# Patient Record
Sex: Male | Born: 1985 | Race: Black or African American | Hispanic: No | Marital: Single | Smoking: Never smoker
Health system: Southern US, Community
[De-identification: ages and names within clinical notes are randomized; demographics above are authoritative.]

## PROBLEM LIST (undated history)

## (undated) DIAGNOSIS — A539 Syphilis, unspecified: Secondary | ICD-10-CM

## (undated) DIAGNOSIS — E669 Obesity, unspecified: Secondary | ICD-10-CM

## (undated) DIAGNOSIS — J45909 Unspecified asthma, uncomplicated: Secondary | ICD-10-CM

## (undated) DIAGNOSIS — K529 Noninfective gastroenteritis and colitis, unspecified: Secondary | ICD-10-CM

## (undated) DIAGNOSIS — N529 Male erectile dysfunction, unspecified: Secondary | ICD-10-CM

## (undated) DIAGNOSIS — B20 Human immunodeficiency virus [HIV] disease: Secondary | ICD-10-CM

## (undated) DIAGNOSIS — Z21 Asymptomatic human immunodeficiency virus [HIV] infection status: Secondary | ICD-10-CM

## (undated) HISTORY — DX: Unspecified asthma, uncomplicated: J45.909

## (undated) HISTORY — DX: Noninfective gastroenteritis and colitis, unspecified: K52.9

## (undated) HISTORY — DX: Syphilis, unspecified: A53.9

## (undated) HISTORY — DX: Obesity, unspecified: E66.9

## (undated) HISTORY — PX: OTHER SURGICAL HISTORY: SHX169

## (undated) HISTORY — DX: Male erectile dysfunction, unspecified: N52.9

---

## 2010-11-13 DIAGNOSIS — A539 Syphilis, unspecified: Secondary | ICD-10-CM

## 2010-11-13 HISTORY — DX: Syphilis, unspecified: A53.9

## 2011-09-21 DIAGNOSIS — K529 Noninfective gastroenteritis and colitis, unspecified: Secondary | ICD-10-CM

## 2011-09-21 HISTORY — DX: Noninfective gastroenteritis and colitis, unspecified: K52.9

## 2014-05-09 ENCOUNTER — Emergency Department (HOSPITAL_COMMUNITY): Payer: Worker's Compensation

## 2014-05-09 ENCOUNTER — Emergency Department (INDEPENDENT_AMBULATORY_CARE_PROVIDER_SITE_OTHER): Payer: Worker's Compensation

## 2014-05-09 ENCOUNTER — Emergency Department (INDEPENDENT_AMBULATORY_CARE_PROVIDER_SITE_OTHER)
Admission: EM | Admit: 2014-05-09 | Discharge: 2014-05-09 | Disposition: A | Discharge status: Home / Self Care | Payer: Worker's Compensation | Source: Home / Self Care | Attending: Emergency Medicine | Admitting: Emergency Medicine

## 2014-05-09 ENCOUNTER — Encounter (HOSPITAL_COMMUNITY): Payer: Self-pay | Admitting: Emergency Medicine

## 2014-05-09 DIAGNOSIS — X500XXA Overexertion from strenuous movement or load, initial encounter: Secondary | ICD-10-CM

## 2014-05-09 DIAGNOSIS — Y9368 Activity, volleyball (beach) (court): Secondary | ICD-10-CM

## 2014-05-09 DIAGNOSIS — S93409A Sprain of unspecified ligament of unspecified ankle, initial encounter: Secondary | ICD-10-CM

## 2014-05-09 HISTORY — DX: Human immunodeficiency virus (HIV) disease: B20

## 2014-05-09 HISTORY — DX: Asymptomatic human immunodeficiency virus (hiv) infection status: Z21

## 2014-05-09 MED ORDER — HYDROCODONE-ACETAMINOPHEN 5-325 MG PO TABS: ORAL_TABLET | ORAL | Status: DC

## 2014-05-09 NOTE — ED Notes (Signed)
Pt c/o right foot inj onset yest pm while at work Reports he was playing volleyball and twisted ankle inward/foot outward Sx include swelling; pain increases w/activity Alert w/no signs of acute distress.

## 2014-05-09 NOTE — Discharge Instructions (Signed)

## 2014-05-09 NOTE — ED Provider Notes (Signed)
Chief Complaint   Chief Complaint  Patient presents with  . 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 He did not hear a pop. There is some swelling. It hurts to ambulate and he has to walk with a limp. It also hurts to move the ankle. There is no numbness or tingling. This happened while he was at work, so this is a workers comp case.  Review of Systems   Other than as noted above, the patient denies any of the following symptoms: Systemic:  No fevers or chills.   Musculoskeletal:  No joint pain or swelling, back pain, or neck pain. Neurological:  No muscular weakness or paresthesias.  PMFSH   Past medical history, family history, social history, meds, and allergies were reviewed.   Physical Examination     Vital signs:  BP 137/72  Pulse 58  Temp(Src) 98.6 F (37 C) (Oral)  Resp 16  SpO2 97% Gen:  Alert and oriented times 3.  In no distress. Musculoskeletal: Exam of the ankle reveals slight swelling and pain to palpation over the lateral malleolus. Anterior drawer sign negative.  Talar tilt negative. Squeeze test negative. Achilles tendon, peroneal tendon, and tibialis posterior were intact. Otherwise, all joints had a full a ROM with no swelling, bruising or deformity.  No edema, pulses full. Extremities were warm and pink.  Capillary refill was brisk.  Skin:  Clear, warm and dry.  No rash. Neuro:  Alert and oriented times 3.  Muscle strength was normal.  Sensation was intact to light touch.   Radiology   Dg Ankle Complete Right  05/09/2014   CLINICAL DATA:  Traumatic injury with pain  EXAM: RIGHT ANKLE - COMPLETE 3+ VIEW  COMPARISON:  None.  FINDINGS: No acute fracture or dislocation is noted. The soft tissue show a rectangular density in the plantar aspect of the foot just  below the anterior aspect of the calcaneus. This may represent a chronic foreign body or artifactual in nature. No other focal abnormality is noted.  IMPRESSION: No acute fracture identified.  Density in the soft tissues in the plantar aspect of foot as described. This may represent a chronic foreign body or artifactual in nature. Foot films may be helpful for further evaluation   Electronically Signed   By: Alcide Clever M.D.   On: 05/09/2014 18:38   I reviewed the images independently and personally and concur with the radiologist's findings.  Course in Urgent Care Center   He was placed in an ASO brace and given crutches.  Assessment   The encounter diagnosis was Ankle sprain.  Since this was a workers comp injury he'll need a followup at occupational health.  Plan     1.  Meds:  The following meds were prescribed:   Discharge Medication List as of 05/09/2014  7:11 PM    START taking these medications   Details  HYDROcodone-acetaminophen (NORCO/VICODIN) 5-325 MG per tablet 1 to 2 tabs every 4 to 6 hours as needed for pain., Print        2.  Patient Education/Counseling:  The patient was given appropriate handouts, self care instructions, including rest and activity, elevation, application of ice and compression, and instructed in symptomatic relief.  Given exercises to start doing in about 3 days.  3.  Follow up:  The patient was told to follow up here if no better in 3 to 4 days, or sooner if becoming worse in any way, and given some red flag symptoms such as increasing pain or neurological symptoms which would prompt immediate return.  Follow up at occupational health in 2 days.     Reuben Likesavid C Mattheu Brodersen, MD 05/09/14 2121

## 2015-08-14 IMAGING — CR DG ANKLE COMPLETE 3+V*R*
3 series · 3 of 3 positions shown · non-contrast
Comparison: None.

CLINICAL DATA: Traumatic injury with pain

EXAM:
RIGHT ANKLE - COMPLETE 3+ VIEW

[view not recorded (1 of 3)]
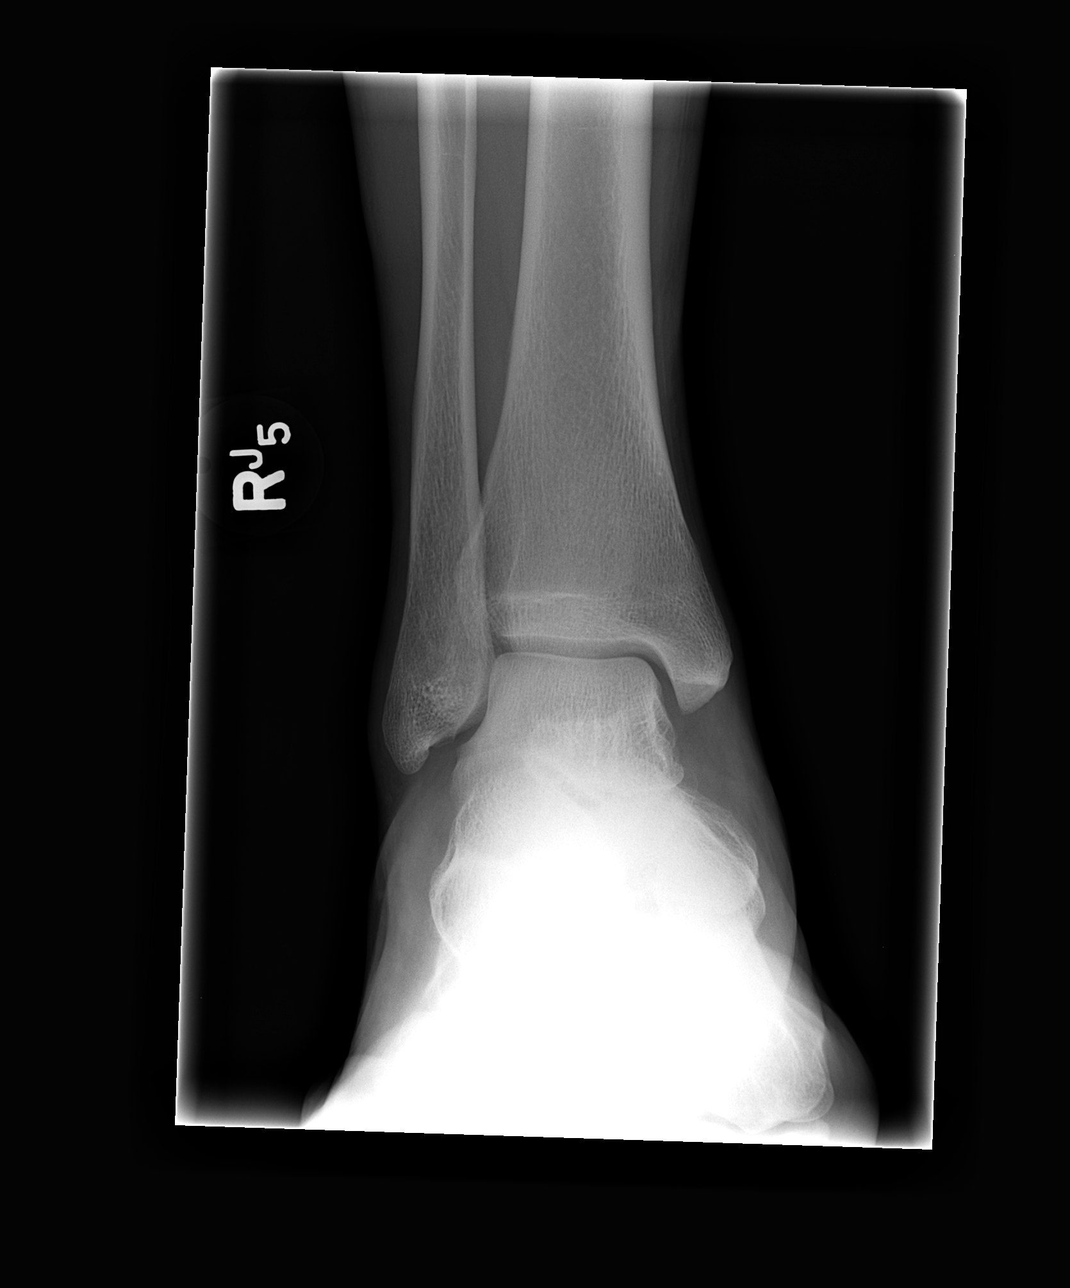

[view not recorded (2 of 3)]
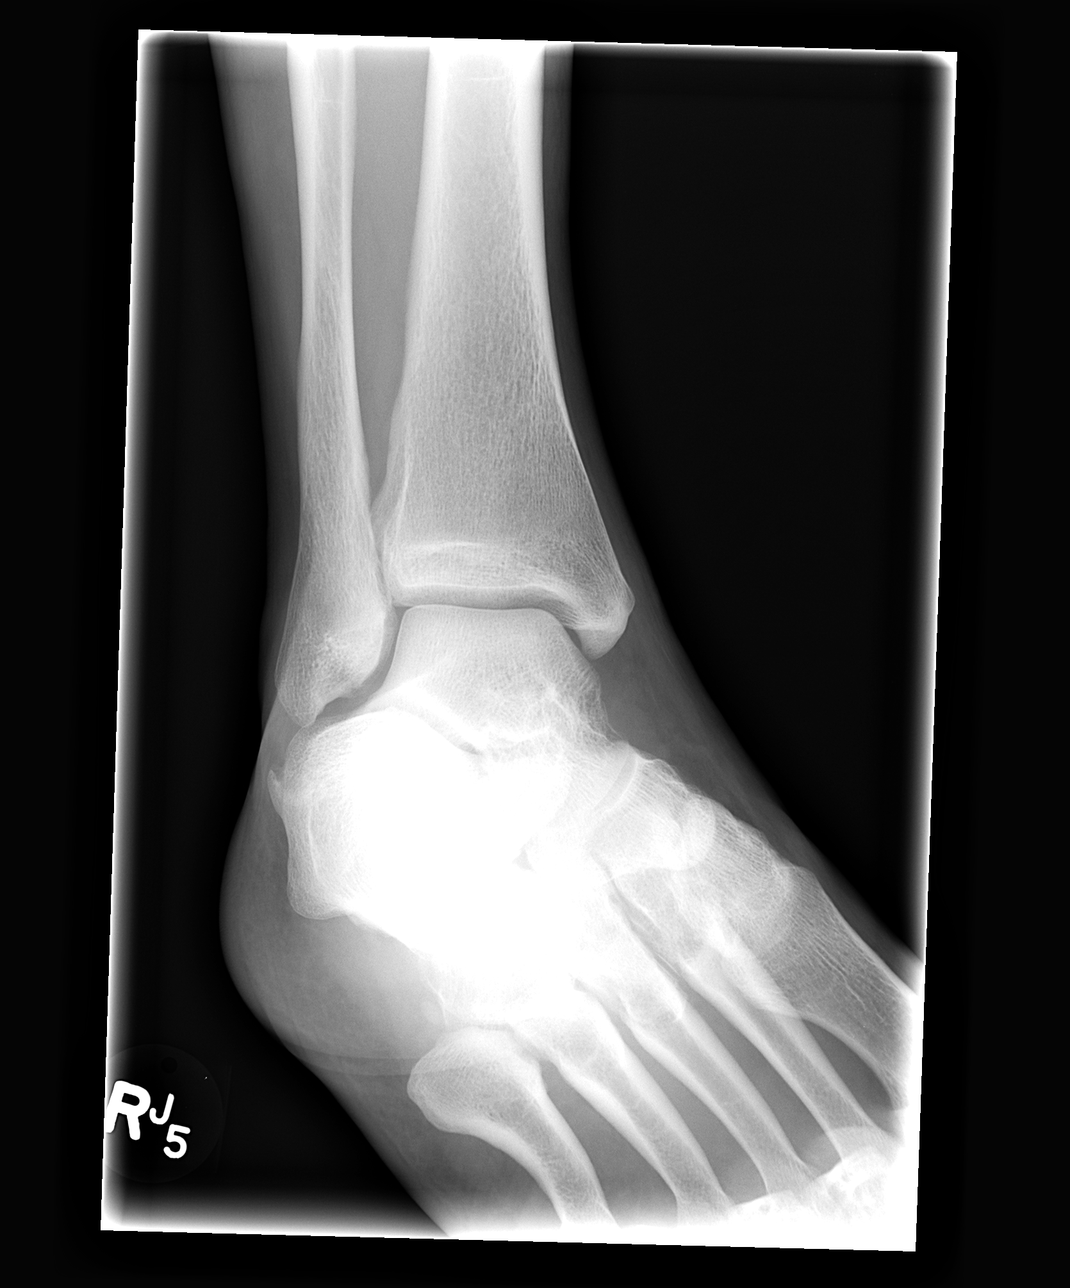

[view not recorded (3 of 3)]
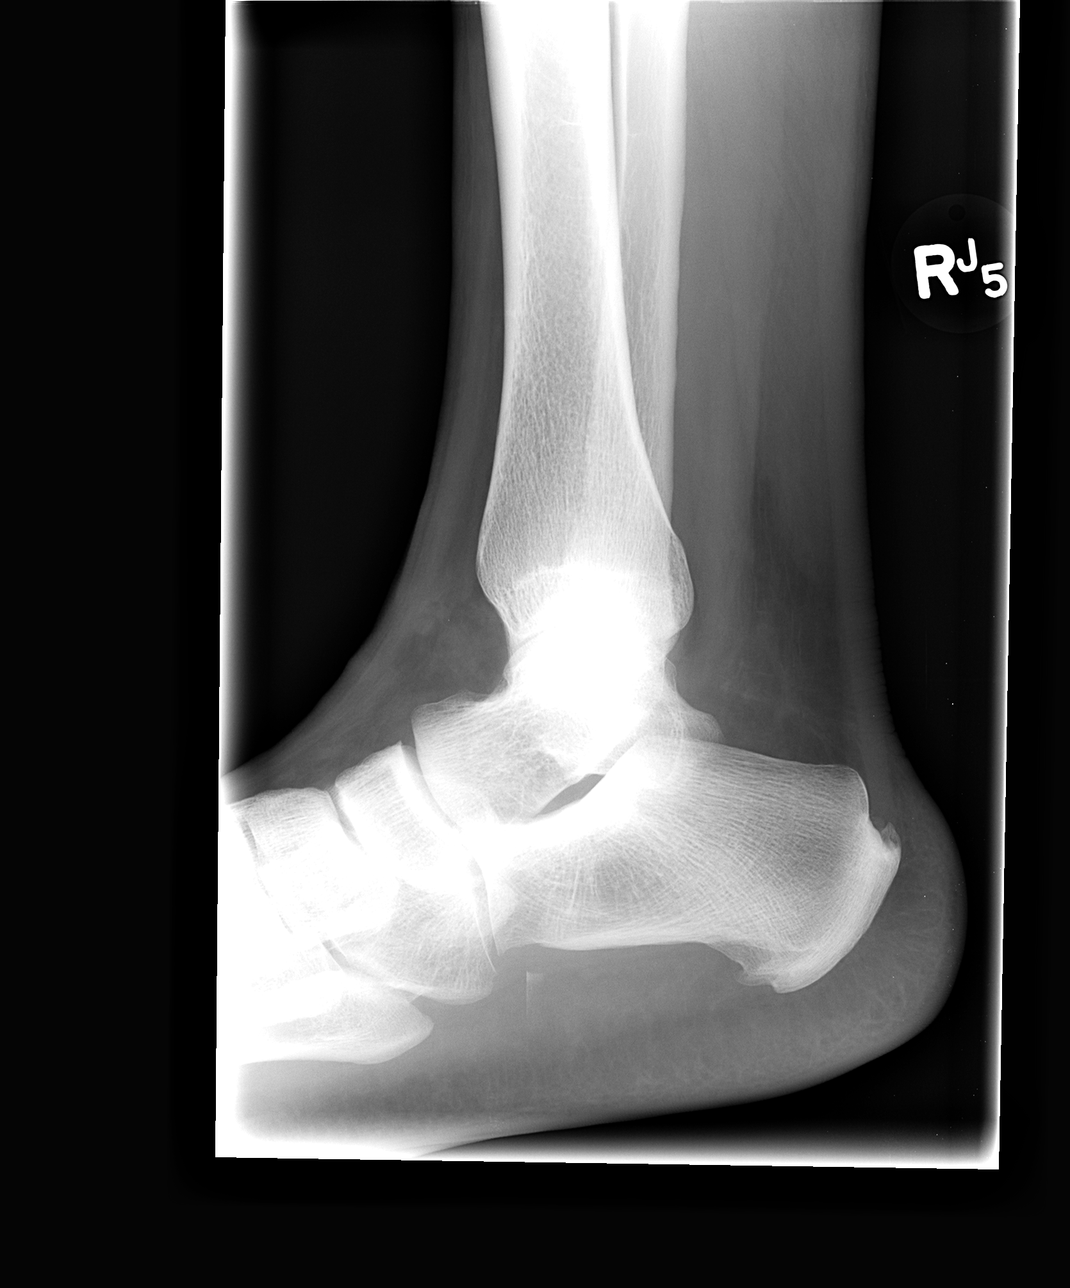

[3 of 3 positions shown; findings below may reference images not displayed]

FINDINGS: No acute fracture or dislocation is noted. The soft tissue show a
rectangular density in the plantar aspect of the foot just below the
anterior aspect of the calcaneus. This may represent a chronic
foreign body or artifactual in nature. No other focal abnormality is
noted.
IMPRESSION: No acute fracture identified.

Density in the soft tissues in the plantar aspect of foot as
described. This may represent a chronic foreign body or artifactual
in nature. Foot films may be helpful for further evaluation

## 2016-09-13 ENCOUNTER — Ambulatory Visit: Payer: Self-pay

## 2016-11-19 DIAGNOSIS — A539 Syphilis, unspecified: Secondary | ICD-10-CM | POA: Insufficient documentation

## 2016-11-19 DIAGNOSIS — B20 Human immunodeficiency virus [HIV] disease: Secondary | ICD-10-CM | POA: Insufficient documentation

## 2016-11-19 DIAGNOSIS — A519 Early syphilis, unspecified: Secondary | ICD-10-CM | POA: Insufficient documentation

## 2016-11-20 ENCOUNTER — Ambulatory Visit (INDEPENDENT_AMBULATORY_CARE_PROVIDER_SITE_OTHER): Payer: BC Managed Care – PPO | Admitting: Internal Medicine

## 2016-11-20 ENCOUNTER — Encounter: Payer: Self-pay | Admitting: Internal Medicine

## 2016-11-20 DIAGNOSIS — Z23 Encounter for immunization: Secondary | ICD-10-CM

## 2016-11-20 DIAGNOSIS — B20 Human immunodeficiency virus [HIV] disease: Secondary | ICD-10-CM | POA: Diagnosis not present

## 2016-11-20 DIAGNOSIS — N529 Male erectile dysfunction, unspecified: Secondary | ICD-10-CM | POA: Insufficient documentation

## 2016-11-20 DIAGNOSIS — N528 Other male erectile dysfunction: Secondary | ICD-10-CM

## 2016-11-20 DIAGNOSIS — Z8619 Personal history of other infectious and parasitic diseases: Secondary | ICD-10-CM | POA: Diagnosis not present

## 2016-11-20 MED ORDER — ELVITEG-COBIC-EMTRICIT-TENOFAF 150-150-200-10 MG PO TABS: 1.0000 | ORAL_TABLET | Freq: Every day | ORAL | 11 refills | Status: DC

## 2016-11-20 NOTE — Assessment & Plan Note (Signed)
His infection is under excellent long-term control. He will continue Genvoya and get lab work today. He will follow-up in 6 months. I had him meet with Ulyses SouthwardMinh Pham, our infectious disease pharmacist today. He was given small pill bottles so that he can keep 1-2 doses of his Genvoya in his car in case he is away from home without his medication. I talked to him about the importance of adherence.

## 2016-11-20 NOTE — Assessment & Plan Note (Signed)
I talked to him about the importance of limiting the number of partners disease has, careful partner selection and consistent condom use. We will monitor his RPR.

## 2016-11-20 NOTE — Assessment & Plan Note (Signed)
He had some problems maintaining erections earlier this year. His grandmother had died and he was feeling somewhat down. Problem seems to have improved since that time.

## 2016-11-20 NOTE — Progress Notes (Signed)
Patient Active Problem List   Diagnosis Date Noted  . HIV disease (HCC) 11/19/2016    Priority: High  . Erectile dysfunction 11/20/2016  . History of syphilis 11/19/2016    Patient's Medications  New Prescriptions   No medications on file  Previous Medications   MULTIPLE VITAMIN (MULTIVITAMIN) TABLET    Take 1 tablet by mouth daily.   SILDENAFIL (VIAGRA) 100 MG TABLET    Take 100 mg by mouth daily as needed for erectile dysfunction.  Modified Medications   Modified Medication Previous Medication   ELVITEGRAVIR-COBICISTAT-EMTRICITABINE-TENOFOVIR (GENVOYA) 150-150-200-10 MG TABS TABLET elvitegravir-cobicistat-emtricitabine-tenofovir (GENVOYA) 150-150-200-10 MG TABS tablet      Take 1 tablet by mouth daily with breakfast.    Take 1 tablet by mouth daily with breakfast.  Discontinued Medications   HYDROCODONE-ACETAMINOPHEN (NORCO/VICODIN) 5-325 MG PER TABLET    1 to 2 tabs every 4 to 6 hours as needed for pain.    Subjective: Aaron Forbes is in for his initial visit to establish ongoing care for his HIV infection. He was diagnosed in 2011 when a male partner tested positive. [facility]. His CD4 count was 468 and his initial viral load was 7500. He started on Atripla. His viral load became undetectable and has been ever since. He transitioned to Stribild and then at the time of his last visit at [facility] this past April [facility]. He takes it each evening with a meal. He recalls missing only once in the past 6 months. This occurred when he went to visit his parents outside [place] and forgot his medication. He is in very good health otherwise. He had asthma as a child but outgrew it. He has mild erectile dysfunction. He is currently not in a relationship and not sexually active. He was treated for syphilis earlier this year. He had one episode of rectal bleeding, hematuria and urinary hesitancy after having anal receptive  intercourse earlier this year. He was evaluated by urology and gastroenterology with no positive findings reported by him. He has had no further episodes. He states that he tries to be consistent with condoms with himself and his partners. He does not use tobacco products and drinks alcohol only socially. He has never used any illicit drugs. He exercises regularly including resistance weightlifting and running. He completed his first 5K run this year.  Since learning of his test results he has shared that information with his brother and with his 32 year old nephew who lives with him. He has not chosen to tell his parents. He graduated from [employer]. 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   Review of Systems: Review of Systems  Constitutional: Negative for chills, diaphoresis, fever, malaise/fatigue and weight loss.  HENT: Negative for sore throat.   Respiratory: Negative for cough, sputum production and shortness of breath.   Cardiovascular: Negative for chest pain.  Gastrointestinal: Negative for abdominal pain, diarrhea, heartburn, nausea and vomiting.  Genitourinary: Negative for dysuria and frequency.  Musculoskeletal: Negative for joint pain and myalgias.  Skin: Negative for rash.  Neurological: Negative for dizziness and headaches.  Psychiatric/Behavioral: Negative for depression and substance abuse. The patient is not nervous/anxious.     Past Medical History:  Diagnosis Date  . Asthma   . Colitis 09/21/2011  . ED (erectile dysfunction)   . HIV (human immunodeficiency virus infection) (HCC)   . Obesity, unspecified obesity  severity, unspecified obesity type   . Syphilis 11/13/2010    Social History  Substance Use Topics  . Smoking status: Never Smoker  . Smokeless tobacco: Never Used  . Alcohol use Yes    Family History  Problem Relation Age of Onset  . Diabetes Mother   . Hypertension Mother   . Diabetes Father   .  Hypertension Father   . Stroke Father     No Known Allergies  Objective:  Vitals:   11/20/16 0854  BP: 131/78  Pulse: (!) 43  Temp: 97.8 F (36.6 C)  TempSrc: Oral  Weight: 241 lb 8 oz (109.5 kg)  Height: 6\' 9"  (2.057 m)   Body mass index is 25.88 kg/m.  Physical Exam  Constitutional: He is oriented to person, place, and time.  He is very pleasant and asked very good questions.  HENT:  Mouth/Throat: No oropharyngeal exudate.  Eyes: Conjunctivae are normal.  Cardiovascular: Normal rate and regular rhythm.   No murmur heard. Pulmonary/Chest: Effort normal and breath sounds normal. He has no wheezes. He has no rales.  Abdominal: Soft. He exhibits no mass. There is no tenderness.  Musculoskeletal: Normal range of motion. He exhibits no edema or tenderness.  Neurological: He is alert and oriented to person, place, and time.  Skin: No rash noted.  Psychiatric: Mood and affect normal.    Lab Results No results found for: WBC, HGB, HCT, MCV, PLT No results found for: CREATININE, BUN, NA, K, CL, CO2 No results found for: ALT, AST, GGT, ALKPHOS, BILITOT  No results found for: CHOL, HDL, LDLCALC, LDLDIRECT, TRIG, CHOLHDL No results found for: [id number], HIV1RNAVL, CD4TABS   Problem List Items Addressed This Visit      High   HIV disease (HCC)    His infection is under excellent long-term control. He will continue Genvoya and get lab work today. He will follow-up in 6 months. I had him meet with Ulyses SouthwardMinh Pham, our infectious disease pharmacist today. He was given small pill bottles so that he can keep 1-2 doses of his Genvoya in his car in case he is away from home without his medication. I talked to him about the importance of adherence.      Relevant Medications   elvitegravir-cobicistat-emtricitabine-tenofovir (GENVOYA) 150-150-200-10 MG TABS tablet   Other Relevant Orders   T-helper cell (CD4)- ([facility] clinic only)   HIV 1 RNA quant-no reflex-bld   T-helper cell (CD4)-  ([facility] clinic only)   HIV 1 RNA quant-no reflex-bld   CBC   Comprehensive metabolic panel   Lipid panel   RPR     Unprioritized   Erectile dysfunction    He had some problems maintaining erections earlier this year. His grandmother had died and he was feeling somewhat down. Problem seems to have improved since that time.      History of syphilis    I talked to him about the importance of limiting the number of partners disease has, careful partner selection and consistent condom use. We will monitor his RPR.       Other Visit Diagnoses    Encounter for immunization       Relevant Orders   Flu Vaccine QUAD 36+ mos IM (Completed)        Cliffton AstersJohn Kyna Blahnik, MD [facility] [phone] pager   [phone] cell 11/20/2016, 12:07 PM

## 2016-11-21 LAB — T-HELPER CELL (CD4) - (RCID CLINIC ONLY)
CD4 T CELL HELPER: 35 % (ref 33–55)
CD4 T Cell Abs: 820 /uL (ref 400–2700)

## 2016-11-25 LAB — HIV-1 RNA QUANT-NO REFLEX-BLD
HIV 1 RNA Quant: <20 copies/mL (ref <20)
HIV-1 RNA Quant, Log: <1.3 Log copies/mL (ref <1.30)

## 2016-12-02 ENCOUNTER — Encounter: Payer: Self-pay | Admitting: *Deleted

## 2017-04-30 ENCOUNTER — Other Ambulatory Visit: Payer: Self-pay

## 2017-05-14 ENCOUNTER — Encounter: Payer: Self-pay | Admitting: Internal Medicine

## 2017-05-14 ENCOUNTER — Ambulatory Visit (INDEPENDENT_AMBULATORY_CARE_PROVIDER_SITE_OTHER): Payer: BC Managed Care – PPO | Admitting: Internal Medicine

## 2017-05-14 VITALS — BP 135/83 | HR 40 | Temp 97.6°F | Ht >= 80 in | Wt 240.0 lb

## 2017-05-14 DIAGNOSIS — B20 Human immunodeficiency virus [HIV] disease: Secondary | ICD-10-CM

## 2017-05-14 DIAGNOSIS — Z23 Encounter for immunization: Secondary | ICD-10-CM

## 2017-05-14 LAB — LIPID PANEL
CHOLESTEROL: 120 mg/dL (ref <200)
HDL: 51 mg/dL (ref >40)
LDL Cholesterol: 60 mg/dL (ref <100)
Total CHOL/HDL Ratio: 2.4 Ratio (ref <5.0)
Triglycerides: 46 mg/dL (ref <150)
VLDL: 9 mg/dL (ref <30)

## 2017-05-14 LAB — CBC
HCT: 42 % (ref 38.5–50.0)
Hemoglobin: 13.9 g/dL (ref 13.2–17.1)
MCH: 30.8 pg (ref 27.0–33.0)
MCHC: 33.1 g/dL (ref 32.0–36.0)
MCV: 92.9 fL (ref 80.0–100.0)
MPV: 9 fL (ref 7.5–12.5)
Platelets: 215 10*3/uL (ref 140–400)
RBC: 4.52 MIL/uL (ref 4.20–5.80)
RDW: 12.5 % (ref 11.0–15.0)
WBC: 4.2 10*3/uL (ref 3.8–10.8)

## 2017-05-14 LAB — COMPREHENSIVE METABOLIC PANEL
ALBUMIN: 4 g/dL (ref 3.6–5.1)
ALT: 24 U/L (ref 9–46)
AST: 31 U/L (ref 10–40)
Alkaline Phosphatase: 34 U/L — ABNORMAL LOW (ref 40–115)
BILIRUBIN TOTAL: 1 mg/dL (ref 0.2–1.2)
BUN: 17 mg/dL (ref 7–25)
CO2: 25 mmol/L (ref 20–31)
CREATININE: 1.01 mg/dL (ref 0.60–1.35)
Calcium: 9.2 mg/dL (ref 8.6–10.3)
Chloride: 103 mmol/L (ref 98–110)
Glucose, Bld: 93 mg/dL (ref 65–99)
Potassium: 4.3 mmol/L (ref 3.5–5.3)
SODIUM: 136 mmol/L (ref 135–146)
Total Protein: 7.3 g/dL (ref 6.1–8.1)

## 2017-05-14 NOTE — Assessment & Plan Note (Signed)
His adherence is very good and his infection was under perfect control at the time of his last blood work in December. He will get repeat blood work today. 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 I've given him to pill canisters to carry on his key chain. He will follow-up in 6 months.

## 2017-05-14 NOTE — Progress Notes (Addendum)
Patient Active Problem List   Diagnosis Date Noted  . HIV disease (HCC) 11/19/2016    Priority: High  . Erectile dysfunction 11/20/2016  . History of syphilis 11/19/2016    Patient's Medications  New Prescriptions   No medications on file  Previous Medications   ELVITEGRAVIR-COBICISTAT-EMTRICITABINE-TENOFOVIR (GENVOYA) 150-150-200-10 MG TABS TABLET    Take 1 tablet by mouth daily with breakfast.   MULTIPLE VITAMIN (MULTIVITAMIN) TABLET    Take 1 tablet by mouth daily.  Modified Medications   No medications on file  Discontinued Medications   SILDENAFIL (VIAGRA) 100 MG TABLET    Take 100 mg by mouth daily as needed for erectile dysfunction.    Subjective: Aaron Forbes is in for his routine HIV follow-up visit. High school year ended yesterday and he is going to be off on vacation for the summer. [place] will be traveling to [place] to spend some time with his sister and [place] His current partner is HIV negative. He is taking Truvada PrEP. They have had unprotected sex. He is feeling well and has no current complaints. He has had no problems obtaining, taking or tolerating Genvoya. He generally takes it each afternoon. He recalls missing only one dose since his last visit.   Review of Systems: Review of Systems  Constitutional: Negative for chills, diaphoresis, fever, malaise/fatigue and weight loss.  HENT: Negative for sore throat.   Respiratory: Negative for cough, sputum production and shortness of breath.   Cardiovascular: Negative for chest pain.  Gastrointestinal: Negative for abdominal pain, diarrhea, heartburn, nausea and vomiting.  Genitourinary: Negative for dysuria and frequency.  Musculoskeletal: Negative for joint pain and myalgias.  Skin: Negative for rash.  Neurological: Negative for dizziness and headaches.  Psychiatric/Behavioral: Negative for depression and substance abuse. The patient is not  nervous/anxious.     Past Medical History:  Diagnosis Date  . Asthma   . Colitis 09/21/2011  . ED (erectile dysfunction)   . HIV (human immunodeficiency virus infection) (HCC)   . Obesity, unspecified obesity severity, unspecified obesity type   . Syphilis 11/13/2010    Social History  Substance Use Topics  . Smoking status: Never Smoker  . Smokeless tobacco: Never Used  . Alcohol use Yes     Comment: socially    Family History  Problem Relation Age of Onset  . Diabetes Mother   . Hypertension Mother   . Diabetes Father   . Hypertension Father   . Stroke Father     No Known Allergies  Objective:  Vitals:   05/14/17 1012  BP: 135/83  Pulse: (!) 40  Temp: 97.6 F (36.4 C)  TempSrc: Oral  Weight: 240 lb (108.9 kg)  Height: 6\' 9"  (2.057 m)   Body mass index is 25.72 kg/m.  Physical Exam  Constitutional: He is oriented to person, place, and time.  He is in good spirits.  HENT:  Mouth/Throat: No oropharyngeal exudate.  Eyes: Conjunctivae are normal.  Cardiovascular: Normal rate and regular rhythm.   No murmur heard. Pulmonary/Chest: Effort normal and breath sounds normal.  Abdominal: Soft. He exhibits no mass. There is no tenderness.  Musculoskeletal: Normal range of motion.  Neurological: He is alert and oriented to person, place, and time.  Skin: No rash noted.  Psychiatric: Mood and affect normal.    Lab Results Lab Results  Component Value Date   WBC 4.2 05/14/2017   HGB 13.9  05/14/2017   HCT 42.0 05/14/2017   MCV 92.9 05/14/2017   PLT 215 05/14/2017    Lab Results  Component Value Date   CREATININE 1.01 05/14/2017   BUN 17 05/14/2017   NA 136 05/14/2017   K 4.3 05/14/2017   CL 103 05/14/2017   CO2 25 05/14/2017    Lab Results  Component Value Date   ALT 24 05/14/2017   AST 31 05/14/2017   ALKPHOS 34 (L) 05/14/2017   BILITOT 1.0 05/14/2017    Lab Results  Component Value Date   CHOL 120 05/14/2017   HDL 51 05/14/2017   LDLCALC  60 05/14/2017   TRIG 46 05/14/2017   CHOLHDL 2.4 05/14/2017   HIV 1 RNA Quant (copies/mL)  Date Value  05/14/2017 <20 NOT DETECTED  11/20/2016 <20   CD4 T Cell Abs (/uL)  Date Value  05/14/2017 1,120  11/20/2016 820     Problem List Items Addressed This Visit      High   HIV disease (HCC) - Primary    His adherence is very good and his infection was under perfect control at the time of his last blood work in December. He will get repeat blood work today. 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 I've given him to pill canisters to carry on his key chain. He will follow-up in 6 months.      Relevant Orders   T-helper cell (CD4)- ([facility] clinic only) (Completed)   HIV 1 RNA quant-no reflex-bld (Completed)   CBC (Completed)   Comprehensive metabolic panel (Completed)   RPR (Completed)   Lipid panel (Completed)   MENINGOCOCCAL MCV4O(MENVEO) (Completed)   Pneumococcal polysaccharide vaccine 23-valent greater than or equal to 2yo subcutaneous/IM (Completed)    Other Visit Diagnoses    Need for meningitis vaccination       Relevant Orders   MENINGOCOCCAL MCV4O(MENVEO) (Completed)   Need for pneumococcal vaccination       Relevant Orders   Pneumococcal polysaccharide vaccine 23-valent greater than or equal to 2yo subcutaneous/IM (Completed)        Cliffton Asters, MD [facility] [phone] pager   [phone] cell 05/22/2017, 2:48 PM

## 2017-05-15 LAB — FLUORESCENT TREPONEMAL AB(FTA)-IGG-BLD: Fluorescent Treponemal ABS: REACTIVE — AB

## 2017-05-15 LAB — T-HELPER CELL (CD4) - (RCID CLINIC ONLY)
CD4 T CELL ABS: 1120 /uL (ref 400–2700)
CD4 T CELL HELPER: 37 % (ref 33–55)

## 2017-05-15 LAB — RPR: RPR Ser Ql: REACTIVE — AB

## 2017-05-15 LAB — RPR TITER

## 2017-05-20 LAB — HIV-1 RNA QUANT-NO REFLEX-BLD
HIV 1 RNA Quant: <20 copies/mL
HIV-1 RNA QUANT, LOG: NOT DETECTED {Log_copies}/mL

## 2017-11-09 ENCOUNTER — Other Ambulatory Visit: Payer: Self-pay | Admitting: Internal Medicine

## 2017-11-10 NOTE — Telephone Encounter (Signed)
Needs office visit.

## 2017-11-17 ENCOUNTER — Ambulatory Visit (INDEPENDENT_AMBULATORY_CARE_PROVIDER_SITE_OTHER): Payer: BC Managed Care – PPO | Admitting: Internal Medicine

## 2017-11-17 ENCOUNTER — Encounter: Payer: Self-pay | Admitting: Internal Medicine

## 2017-11-17 DIAGNOSIS — B20 Human immunodeficiency virus [HIV] disease: Secondary | ICD-10-CM

## 2017-11-17 DIAGNOSIS — Z23 Encounter for immunization: Secondary | ICD-10-CM | POA: Diagnosis not present

## 2017-11-17 DIAGNOSIS — Z8619 Personal history of other infectious and parasitic diseases: Secondary | ICD-10-CM

## 2017-11-17 MED ORDER — ELVITEG-COBIC-EMTRICIT-TENOFAF 150-150-200-10 MG PO TABS: 1.0000 | ORAL_TABLET | Freq: Every day | ORAL | 11 refills | Status: DC
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
## 2017-11-17 NOTE — Assessment & Plan Note (Signed)
His infection has been under excellent, long-term control. He will get lab work today, continue [place] and follow-up in one year. I did talk to him about the importance of having a frank discussion with his partner about the danger of not having a mutually monogamous relationship.

## 2017-11-17 NOTE — Assessment & Plan Note (Signed)
I will get records from his primary care provider about his recent STD testing. He will have an RPR checked here today. The issue of using doxycycline as postexposure prophylaxis for the prevention of syphilis is still an open question. I suggested that he stop doxycycline now. He knows to call me if he develops any lesions.

## 2017-11-18 ENCOUNTER — Telehealth: Payer: Self-pay | Admitting: *Deleted

## 2017-11-18 ENCOUNTER — Encounter: Payer: Self-pay | Admitting: Internal Medicine

## 2017-11-18 ENCOUNTER — Other Ambulatory Visit: Payer: Self-pay | Admitting: Internal Medicine

## 2017-11-18 DIAGNOSIS — A519 Early syphilis, unspecified: Secondary | ICD-10-CM

## 2017-11-18 LAB — RPR: RPR: REACTIVE — AB

## 2017-11-18 LAB — RPR TITER: RPR Titer: 1:64 {titer} — ABNORMAL HIGH

## 2017-11-18 LAB — FLUORESCENT TREPONEMAL AB(FTA)-IGG-BLD: FLUORESCENT TREPONEMAL ABS: REACTIVE — AB

## 2017-11-18 MED ORDER — PENICILLIN G BENZATHINE 1200000 UNIT/2ML IM SUSP: 1.2000 10*6.[IU] | Freq: Once | INTRAMUSCULAR | Status: DC

## 2017-11-18 NOTE — Telephone Encounter (Signed)
Per Dr. 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 Called patient and no answer and his voice mail is full. Will try to call in the AM. Aaron MolaJacqueline Cockerham CMA

## 2017-11-19 LAB — HIV-1 RNA QUANT-NO REFLEX-BLD
HIV 1 RNA QUANT: NOT DETECTED {copies}/mL
HIV-1 RNA Quant, Log: <1.3 Log copies/mL

## 2017-11-19 LAB — T-HELPER CELL (CD4) - (RCID CLINIC ONLY)
CD4 % Helper T Cell: 39 % (ref 33–55)
CD4 T Cell Abs: 1150 /uL (ref 400–2700)

## 2017-11-20 NOTE — Telephone Encounter (Signed)
Per Dr. Guilford Shiampbell, Bobbie at the health department has been notified and she will let Mathis FareAlbert know as well. They had [facility] They do have patient's demographic info. Wendall MolaJacqueline Cockerham CMA

## 2018-07-25 ENCOUNTER — Other Ambulatory Visit: Payer: Self-pay

## 2018-07-25 ENCOUNTER — Ambulatory Visit (HOSPITAL_COMMUNITY)
Admission: EM | Admit: 2018-07-25 | Discharge: 2018-07-25 | Disposition: A | Discharge status: Home / Self Care | Payer: BC Managed Care – PPO | Attending: Internal Medicine | Admitting: Internal Medicine

## 2018-07-25 ENCOUNTER — Encounter (HOSPITAL_COMMUNITY): Payer: Self-pay

## 2018-07-25 DIAGNOSIS — W57XXXA Bitten or stung by nonvenomous insect and other nonvenomous arthropods, initial encounter: Secondary | ICD-10-CM

## 2018-07-25 DIAGNOSIS — S70362A Insect bite (nonvenomous), left thigh, initial encounter: Secondary | ICD-10-CM

## 2018-07-25 MED ORDER — TRIAMCINOLONE ACETONIDE 0.1 % EX CREA: 1.0000 "application " | TOPICAL_CREAM | Freq: Two times a day (BID) | CUTANEOUS | 0 refills | Status: DC

## 2018-07-25 NOTE — Discharge Instructions (Signed)
Continue with benadryl regularly.  Ice application.  Use of kenalog cream for the next few days until symptoms resolve, shouldn't use this long term while taking Genvoya.  I would expect gradual improvement over the next week.  If develop worsening of pain, redness, swelling, drainage or otherwise worsening please return to be seen.

## 2018-07-25 NOTE — ED Triage Notes (Signed)
Pt has a insect bite on the back of his left leg, this happened today.

## 2018-07-25 NOTE — ED Provider Notes (Signed)
[facility]    CSN: [id number] Arrival date & time: 07/25/18  1534     History   Chief Complaint Chief Complaint  Patient presents with  . Insect Bite    HPI Aaron Forbes is a 32 y.o. male.   Aaron Forbes presents with complaints of bug bite to distal posterior thigh which occurred yesterday. States he was jogging and he felt it immediately and was painful. Has since been taking benadryl and applying ice. Mildly tender but has not increased. No increased in diameter but mildly more puffy than had been . No further pain. No itching. No drainage. Unknown what type of bug. 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 Hx of asthma, colitis, syphilis.    ROS per HPI.      Past Medical History:  Diagnosis Date  . Asthma   . Colitis 09/21/2011  . ED (erectile dysfunction)   . HIV (human immunodeficiency virus infection) (HCC)   . Obesity, unspecified obesity severity, unspecified obesity type   . Syphilis 11/13/2010    Patient Active Problem List   Diagnosis Date Noted  . Erectile dysfunction 11/20/2016  . HIV disease (HCC) 11/19/2016  . 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 Dental extractions         Home Medications    Prior to Admission medications   Medication Sig Start Date End Date Taking? Authorizing Provider  elvitegravir-cobicistat-emtricitabine-tenofovir (GENVOYA) 150-150-200-10 MG TABS tablet Take 1 tablet by mouth daily with breakfast. 11/17/17   Cliffton Astersampbell, John, MD  Multiple Vitamin (MULTIVITAMIN) tablet Take 1 tablet by mouth daily.    [provider]  triamcinolone cream (KENALOG) 0.1 % Apply 1 application topically 2 (two) times daily. 07/25/18   Georgetta HaberBurky, Natalie B, NP    Family History Family History  Problem Relation Age of Onset  . Diabetes Mother   . Hypertension Mother   . Diabetes Father   . Hypertension Father    . Stroke Father     Social History Social History   Tobacco Use  . Smoking status: Never Smoker  . Smokeless tobacco: Never Used  Substance Use Topics  . Alcohol use: Yes    Comment: socially  . Drug use: No     Allergies   Patient has no known allergies.   Review of Systems Review of Systems   Physical Exam Triage Vital Signs ED Triage Vitals  Enc Vitals Group     BP 07/25/18 1548 (!) 155/72     Pulse --      Resp 07/25/18 1548 18     Temp 07/25/18 1548 97.8 F (36.6 C)     Temp Source 07/25/18 1548 Oral     SpO2 07/25/18 1548 100 %     Weight 07/25/18 1551 231 lb (104.8 kg)     Height --      Head Circumference --      Peak Flow --      Pain Score 07/25/18 1551 8     Pain Loc --      Pain Edu? --      Excl. in GC? --    No data found.  Updated Vital Signs BP (!) 155/72   Temp 97.8 F (36.6 C) (Oral)   Resp 18   Wt 231 lb (104.8 kg)   SpO2 100%   BMI 24.75 kg/m    Physical  Exam  Constitutional: He is oriented to person, place, and time. He appears well-developed and well-nourished.  Cardiovascular: Normal rate and regular rhythm.  Pulmonary/Chest: Effort normal and breath sounds normal.  Musculoskeletal:       Left upper leg: He exhibits tenderness and swelling. He exhibits no deformity and no laceration.       Legs: Redness and swelling, well defined, soft, no fluctuance or induration; non tender; small puncture at center, no foreign body; no drainage  Neurological: He is alert and oriented to person, place, and time.  Skin: Skin is warm and dry.     UC Treatments / Results  Labs (all labs ordered are listed, but only abnormal results are displayed) Labs Reviewed - No data to display  EKG None  Radiology No results found.  Procedures Procedures (including critical care time)  Medications Ordered in UC Medications - No data to display  Initial Impression / Assessment and Plan / UC Course  I have reviewed the triage vital signs  and the nursing notes.  Pertinent labs & imaging results that were available during my care of the patient were reviewed by me and considered in my medical decision making (see chart for details).     Finding consistent with localized reaction from bug bite. Not worsening. Continue with supportive cares, ice, benadryl, kenalog as needed. Return precautions provided. Patient verbalized understanding and agreeable to plan.    Final Clinical Impressions(s) / UC Diagnoses   Final diagnoses:  Insect bite of left thigh, initial encounter     Discharge Instructions     Continue with benadryl regularly.  Ice application.  Use of kenalog cream for the next few days until symptoms resolve, shouldn't use this long term while taking Genvoya.  I would expect gradual improvement over the next week.  If develop worsening of pain, redness, swelling, drainage or otherwise worsening please return to be seen.    ED Prescriptions    Medication Sig Dispense Auth. Provider   triamcinolone cream (KENALOG) 0.1 % Apply 1 application topically 2 (two) times daily. 30 g Georgetta Haber, NP     Controlled Substance Prescriptions [place] Controlled Substance Registry consulted? Not Applicable   Georgetta Haber, NP 07/25/18 1700

## 2018-11-03 ENCOUNTER — Other Ambulatory Visit: Payer: BC Managed Care – PPO

## 2018-11-03 DIAGNOSIS — B20 Human immunodeficiency virus [HIV] disease: Secondary | ICD-10-CM

## 2018-11-05 LAB — COMPREHENSIVE METABOLIC PANEL
AG Ratio: 1.3 (calc) (ref 1.0–2.5)
ALT: 19 U/L (ref 9–46)
AST: 31 U/L (ref 10–40)
Albumin: 4.3 g/dL (ref 3.6–5.1)
Alkaline phosphatase (APISO): 44 U/L (ref 40–115)
BUN: 15 mg/dL (ref 7–25)
CO2: 29 mmol/L (ref 20–32)
Calcium: 9.6 mg/dL (ref 8.6–10.3)
Chloride: 102 mmol/L (ref 98–110)
Creat: 1.16 mg/dL (ref 0.60–1.35)
Globulin: 3.3 g/dL (calc) (ref 1.9–3.7)
Glucose, Bld: 85 mg/dL (ref 65–99)
Potassium: 4.3 mmol/L (ref 3.5–5.3)
Sodium: 140 mmol/L (ref 135–146)
Total Bilirubin: 0.6 mg/dL (ref 0.2–1.2)
Total Protein: 7.6 g/dL (ref 6.1–8.1)

## 2018-11-05 LAB — CBC
HCT: 42.9 % (ref 38.5–50.0)
Hemoglobin: 14.8 g/dL (ref 13.2–17.1)
MCH: 31.4 pg (ref 27.0–33.0)
MCHC: 34.5 g/dL (ref 32.0–36.0)
MCV: 91.1 fL (ref 80.0–100.0)
MPV: 10.2 fL (ref 7.5–12.5)
PLATELETS: 264 10*3/uL (ref 140–400)
RBC: 4.71 10*6/uL (ref 4.20–5.80)
RDW: 10.9 % — ABNORMAL LOW (ref 11.0–15.0)
WBC: 5.4 10*3/uL (ref 3.8–10.8)

## 2018-11-05 LAB — HIV-1 RNA QUANT-NO REFLEX-BLD
HIV 1 RNA Quant: <20 copies/mL
HIV-1 RNA Quant, Log: <1.3 Log copies/mL

## 2018-11-05 LAB — LIPID PANEL
CHOL/HDL RATIO: 2.6 (calc) (ref <5.0)
Cholesterol: 144 mg/dL (ref <200)
HDL: 55 mg/dL (ref >40)
LDL Cholesterol (Calc): 72 mg/dL (calc)
Non-HDL Cholesterol (Calc): 89 mg/dL (calc) (ref <130)
Triglycerides: 88 mg/dL (ref <150)

## 2018-11-05 LAB — RPR: RPR Ser Ql: REACTIVE — AB

## 2018-11-05 LAB — RPR TITER

## 2018-11-05 LAB — T-HELPER CELL (CD4) - (RCID CLINIC ONLY)
CD4 % Helper T Cell: 39 % (ref 33–55)
CD4 T Cell Abs: 1220 /uL (ref 400–2700)

## 2018-11-05 LAB — FLUORESCENT TREPONEMAL AB(FTA)-IGG-BLD: Fluorescent Treponemal ABS: REACTIVE — AB

## 2018-11-17 ENCOUNTER — Ambulatory Visit: Payer: Self-pay | Admitting: Internal Medicine

## 2018-11-17 ENCOUNTER — Other Ambulatory Visit: Payer: Self-pay

## 2018-11-22 ENCOUNTER — Other Ambulatory Visit: Payer: Self-pay | Admitting: Internal Medicine

## 2018-12-02 ENCOUNTER — Other Ambulatory Visit: Payer: Self-pay

## 2018-12-09 ENCOUNTER — Other Ambulatory Visit: Payer: Self-pay

## 2018-12-13 ENCOUNTER — Other Ambulatory Visit: Payer: Self-pay

## 2018-12-13 MED ORDER — ELVITEG-COBIC-EMTRICIT-TENOFAF 150-150-200-10 MG PO TABS: 1.0000 | ORAL_TABLET | Freq: Every day | ORAL | 0 refills | Status: DC

## 2018-12-16 ENCOUNTER — Other Ambulatory Visit (HOSPITAL_COMMUNITY)
Admission: RE | Admit: 2018-12-16 | Discharge: 2018-12-16 | Disposition: A | Discharge status: Home / Self Care | Payer: BC Managed Care – PPO | Source: Ambulatory Visit | Attending: Internal Medicine | Admitting: Internal Medicine

## 2018-12-16 ENCOUNTER — Ambulatory Visit (INDEPENDENT_AMBULATORY_CARE_PROVIDER_SITE_OTHER): Payer: BC Managed Care – PPO | Admitting: Internal Medicine

## 2018-12-16 ENCOUNTER — Encounter: Payer: Self-pay | Admitting: Internal Medicine

## 2018-12-16 ENCOUNTER — Ambulatory Visit (INDEPENDENT_AMBULATORY_CARE_PROVIDER_SITE_OTHER): Payer: BC Managed Care – PPO | Admitting: Licensed Clinical Social Worker

## 2018-12-16 VITALS — BP 151/75 | HR 60 | Temp 98.0°F | Ht >= 80 in | Wt 219.0 lb

## 2018-12-16 DIAGNOSIS — F329 Major depressive disorder, single episode, unspecified: Secondary | ICD-10-CM | POA: Diagnosis not present

## 2018-12-16 DIAGNOSIS — Z23 Encounter for immunization: Secondary | ICD-10-CM | POA: Diagnosis not present

## 2018-12-16 DIAGNOSIS — F4321 Adjustment disorder with depressed mood: Secondary | ICD-10-CM

## 2018-12-16 DIAGNOSIS — F32A Depression, unspecified: Secondary | ICD-10-CM | POA: Insufficient documentation

## 2018-12-16 DIAGNOSIS — Z79899 Other long term (current) drug therapy: Secondary | ICD-10-CM

## 2018-12-16 DIAGNOSIS — A519 Early syphilis, unspecified: Secondary | ICD-10-CM

## 2018-12-16 DIAGNOSIS — B20 Human immunodeficiency virus [HIV] disease: Secondary | ICD-10-CM

## 2018-12-16 NOTE — Assessment & Plan Note (Signed)
His RPR is coming down appropriately after treatment for syphilis last year.  He will be tested for [facility] and chlamydia today.

## 2018-12-16 NOTE — Assessment & Plan Note (Signed)
We will schedule him to see our behavioral health counselor.

## 2018-12-16 NOTE — Assessment & Plan Note (Signed)
His infection remains under excellent, long-term control.  He received his influenza vaccine today.  He will continue Genvoya and follow-up after lab work in 1 year. 

## 2018-12-16 NOTE — Progress Notes (Addendum)
Oral/anal swabs obtained and sent to lab. Menvo#2 and Flu vaccines administered and patient tolerated well. Patient also introduced to [place] during visit. S.Debroah Shuttleworth, LPN

## 2018-12-16 NOTE — Progress Notes (Signed)
Patient Active Problem List   Diagnosis Date Noted  . HIV disease (HCC) 11/19/2016    Priority: High  . Depression 12/16/2018  . Erectile dysfunction 11/20/2016  . 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   MULTIPLE VITAMIN (MULTIVITAMIN) TABLET    Take 1 tablet by mouth daily.   TRIAMCINOLONE CREAM (KENALOG) 0.1 %    Apply 1 application topically 2 (two) times daily.  Modified Medications   No medications on file  Discontinued Medications   No medications on file    Subjective: Aaron Forbes is in for his routine HIV follow-up visit.  He has had no problems obtaining, taking or tolerating his Genvoya and denies missing any doses.  His grandmother died last month.  He also ended his long-term relationship with his male partner.  He has felt somewhat down and depressed over the last month.  Review of Systems: Review of Systems  Constitutional: Negative for chills, diaphoresis and fever.  Gastrointestinal: Negative for abdominal pain, diarrhea, nausea and vomiting.  Genitourinary: Negative for dysuria.  Psychiatric/Behavioral: Positive for depression. Negative for suicidal ideas. The patient is not nervous/anxious.     Past Medical History:  Diagnosis Date  . Asthma   . Colitis 09/21/2011  . ED (erectile dysfunction)   . HIV (human immunodeficiency virus infection) (HCC)   . Obesity, unspecified obesity severity, unspecified obesity type   . Syphilis 11/13/2010    Social History   Tobacco Use  . Smoking status: Never Smoker  . Smokeless tobacco: Never Used  Substance Use Topics  . Alcohol use: Yes    Comment: socially  . Drug use: No    Family History  Problem Relation Age of Onset  . Diabetes Mother   . Hypertension Mother   . Diabetes  Father   . Hypertension Father   . Stroke Father     No Known Allergies  Health Maintenance  Topic Date Due  . TETANUS/TDAP  12/01/2016  . INFLUENZA VACCINE  07/01/2018  . HIV Screening  Completed    Objective:  Vitals:   12/16/18 1503  BP: (!) 151/75  Pulse: 60  Temp: 98 F (36.7 C)  Weight: 219 lb (99.3 kg)  Height: 6\' 9"  (2.057 m)   Body mass index is 23.47 kg/m.  Physical Exam Constitutional:      Comments: He is in no distress.  Cardiovascular:     Rate and Rhythm: Normal rate and regular rhythm.     Heart sounds: No murmur.  Pulmonary:     Effort: Pulmonary effort is normal.     Breath sounds: Normal breath sounds.  Abdominal:     Palpations: Abdomen is soft.     Tenderness: There is no abdominal tenderness.  Skin:    Findings: No rash.  Psychiatric:        Mood and Affect: Mood normal.     Lab Results Lab Results  Component Value Date   WBC 5.4 11/03/2018   HGB 14.8 11/03/2018   HCT 42.9 11/03/2018   MCV 91.1 11/03/2018   PLT 264 11/03/2018    Lab Results  Component Value Date   CREATININE 1.16 11/03/2018   BUN 15 11/03/2018   NA 140 11/03/2018   K 4.3 11/03/2018   CL 102 11/03/2018  CO2 29 11/03/2018    Lab Results  Component Value Date   ALT 19 11/03/2018   AST 31 11/03/2018   ALKPHOS 34 (L) 05/14/2017   BILITOT 0.6 11/03/2018    Lab Results  Component Value Date   CHOL 144 11/03/2018   HDL 55 11/03/2018   LDLCALC 72 11/03/2018   TRIG 88 11/03/2018   CHOLHDL 2.6 11/03/2018   Lab Results  Component Value Date   LABRPR REACTIVE (A) 11/03/2018   RPRTITER 1:2 (H) 11/03/2018   HIV 1 RNA Quant (copies/mL)  Date Value  11/03/2018 <20 NOT DETECTED  11/17/2017 <20 NOT DETECTED  05/14/2017 <20 NOT DETECTED   CD4 T Cell Abs (/uL)  Date Value  11/03/2018 1,220  11/17/2017 1,150  05/14/2017 1,120     Problem List Items Addressed This Visit      High   HIV disease (HCC)    His infection remains under excellent,  long-term control.  He received his influenza vaccine today.  He will continue Genvoya and follow-up after lab work in 1 year.      Relevant Orders   Urine cytology ancillary only   Cytology (oral, anal, urethral) ancillary only   Cytology (oral, anal, urethral) ancillary only   CBC   T-helper cell (CD4)- ([facility] clinic only)   Comprehensive metabolic panel   Lipid panel   RPR   HIV-1 RNA quant-no reflex-bld     Unprioritized   Early syphilis    His RPR is coming down appropriately after treatment for syphilis last year.  [facility].      Depression    We will schedule him to see our behavioral health counselor.           Cliffton AstersJohn Sharesa Kemp, MD [facility] [phone] pager   [phone] cell 12/16/2018, 3:24 PM

## 2018-12-16 NOTE — Addendum Note (Signed)
Addended by: Valarie Cones on: 12/16/2018 03:54 PM   Modules accepted: Orders

## 2018-12-17 LAB — CYTOLOGY, (ORAL, ANAL, URETHRAL) ANCILLARY ONLY
Chlamydia: NEGATIVE
Chlamydia: NEGATIVE
Neisseria Gonorrhea: NEGATIVE
Neisseria Gonorrhea: NEGATIVE

## 2018-12-17 LAB — URINE CYTOLOGY ANCILLARY ONLY
Chlamydia: NEGATIVE
Neisseria Gonorrhea: NEGATIVE

## 2018-12-17 NOTE — BH Specialist Note (Signed)
Integrated Behavioral Health Initial Visit  MRN: [id number] Name: Aaron Forbes  Number of Integrated Behavioral Health Clinician visits:: 1/6 Session Start time: 3:00pm  Session End time: 3:20pm Total time: 20 minutes  Type of Service: Integrated Behavioral Health- Individual/Family Interpretor:No. Interpretor Name and Language: n/a   Warm Hand Off Completed.       SUBJECTIVE: Aaron Forbes is a 33 y.o. male accompanied by self Patient was referred by Dr. Orvan Falconer for loss and depressive symptoms. Patient reports the following symptoms/concerns: recent losses, crying spells, depressed mood, blunted affect, feelings of loneliness, low energy and motivation, tendency to isolate. Duration of problem: one month; Severity of problem: moderate  OBJECTIVE: Mood: Depressed and Affect: Blunt Risk of harm to self or others: No plan to harm self or others  LIFE CONTEXT: Patient has experienced multiple losses lately, and states that this has him off balance. 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   GOALS ADDRESSED: Patient will: 1. Demonstrate ability to: Increase healthy adjustment to current life circumstances  INTERVENTIONS: Interventions utilized: Motivational Interviewing and Supportive Counseling    ASSESSMENT: Patient currently experiencing recent losses, crying spells, depressed mood, blunted affect, feelings of loneliness, low energy and motivation, tendency to isolate. His grandmother died a little over a month ago, and he and his boyfriend also broke up within the last month. Given grief and recent changes, the most appropriate diagnosis for patient's symptoms is Adjustment Disorder with Depressed Mood. Counselor will continue to assess for a standalone mood disorder.   Counselor educated patient on counseling services available at [facility]. This discussion included scope of practice,  appointments/scheduling, and accessibility. Patient indicates that he thinks counseling would be helpful to him. Counselor guided patient in discussion of his goals for himself and what achieving these goals would look like. Patient stated that going back to work/school has been very helpful for him because he has something to focus on. Counselor normalized this experience, and encouraged patient not to fall into avoiding his feelings.   Patient may benefit from ongoing CBT sessions.  PLAN: 1. Patient did not make a follow-up appointment but stated he would call to set one.   Angus Palms, LCSW

## 2018-12-24 ENCOUNTER — Other Ambulatory Visit: Payer: Self-pay | Admitting: Internal Medicine

## 2019-07-14 ENCOUNTER — Other Ambulatory Visit: Payer: Self-pay | Admitting: Internal Medicine

## 2019-12-19 ENCOUNTER — Other Ambulatory Visit: Payer: Self-pay | Admitting: *Deleted

## 2019-12-19 ENCOUNTER — Other Ambulatory Visit: Payer: Self-pay

## 2019-12-19 ENCOUNTER — Other Ambulatory Visit: Payer: BC Managed Care – PPO

## 2019-12-19 ENCOUNTER — Other Ambulatory Visit (HOSPITAL_COMMUNITY)
Admission: RE | Admit: 2019-12-19 | Discharge: 2019-12-19 | Disposition: A | Discharge status: Home / Self Care | Payer: BC Managed Care – PPO | Source: Ambulatory Visit | Attending: Internal Medicine | Admitting: Internal Medicine

## 2019-12-19 DIAGNOSIS — B20 Human immunodeficiency virus [HIV] disease: Secondary | ICD-10-CM | POA: Insufficient documentation

## 2019-12-19 DIAGNOSIS — Z79899 Other long term (current) drug therapy: Secondary | ICD-10-CM

## 2019-12-19 DIAGNOSIS — Z113 Encounter for screening for infections with a predominantly sexual mode of transmission: Secondary | ICD-10-CM

## 2019-12-20 LAB — URINE CYTOLOGY ANCILLARY ONLY
Chlamydia: NEGATIVE
Comment: NEGATIVE
Comment: NORMAL
Neisseria Gonorrhea: NEGATIVE

## 2019-12-20 LAB — T-HELPER CELL (CD4) - (RCID CLINIC ONLY)
CD4 % Helper T Cell: 42 % (ref 33–65)
CD4 T Cell Abs: 881 /uL (ref 400–1790)

## 2019-12-22 LAB — COMPLETE METABOLIC PANEL WITH GFR
AG Ratio: 1.3 (calc) (ref 1.0–2.5)
ALT: 26 U/L (ref 9–46)
AST: 33 U/L (ref 10–40)
Albumin: 4 g/dL (ref 3.6–5.1)
Alkaline phosphatase (APISO): 49 U/L (ref 36–130)
BUN: 18 mg/dL (ref 7–25)
CO2: 30 mmol/L (ref 20–32)
Calcium: 9.4 mg/dL (ref 8.6–10.3)
Chloride: 101 mmol/L (ref 98–110)
Creat: 1.07 mg/dL (ref 0.60–1.35)
GFR, Est African American: 104 mL/min/{1.73_m2} (ref >=60)
GFR, Est Non African American: 89 mL/min/{1.73_m2} (ref >=60)
Globulin: 3.1 g/dL (calc) (ref 1.9–3.7)
Glucose, Bld: 92 mg/dL (ref 65–99)
Potassium: 4.4 mmol/L (ref 3.5–5.3)
Sodium: 137 mmol/L (ref 135–146)
Total Bilirubin: 1.1 mg/dL (ref 0.2–1.2)
Total Protein: 7.1 g/dL (ref 6.1–8.1)

## 2019-12-22 LAB — RPR: RPR Ser Ql: REACTIVE — AB

## 2019-12-22 LAB — CBC WITH DIFFERENTIAL/PLATELET
Absolute Monocytes: 844 cells/uL (ref 200–950)
Basophils Absolute: 23 cells/uL (ref 0–200)
Basophils Relative: 0.4 %
Eosinophils Absolute: 91 cells/uL (ref 15–500)
Eosinophils Relative: 1.6 %
HCT: 39.1 % (ref 38.5–50.0)
Hemoglobin: 13.3 g/dL (ref 13.2–17.1)
Lymphs Abs: 2166 cells/uL (ref 850–3900)
MCH: 31 pg (ref 27.0–33.0)
MCHC: 34 g/dL (ref 32.0–36.0)
MCV: 91.1 fL (ref 80.0–100.0)
MPV: 9.8 fL (ref 7.5–12.5)
Monocytes Relative: 14.8 %
Neutro Abs: 2576 cells/uL (ref 1500–7800)
Neutrophils Relative %: 45.2 %
Platelets: 231 10*3/uL (ref 140–400)
RBC: 4.29 10*6/uL (ref 4.20–5.80)
RDW: 11.3 % (ref 11.0–15.0)
Total Lymphocyte: 38 %
WBC: 5.7 10*3/uL (ref 3.8–10.8)

## 2019-12-22 LAB — HIV-1 RNA QUANT-NO REFLEX-BLD
HIV 1 RNA Quant: <20 copies/mL
HIV-1 RNA Quant, Log: <1.3 Log copies/mL

## 2019-12-22 LAB — LIPID PANEL
Cholesterol: 117 mg/dL (ref <200)
HDL: 45 mg/dL (ref >=40)
LDL Cholesterol (Calc): 58 mg/dL (calc)
Non-HDL Cholesterol (Calc): 72 mg/dL (calc) (ref <130)
Total CHOL/HDL Ratio: 2.6 (calc) (ref <5.0)
Triglycerides: 63 mg/dL (ref <150)

## 2019-12-22 LAB — RPR TITER: RPR Titer: 1:16 {titer} — ABNORMAL HIGH

## 2019-12-22 LAB — FLUORESCENT TREPONEMAL AB(FTA)-IGG-BLD: Fluorescent Treponemal ABS: REACTIVE — AB

## 2020-01-02 ENCOUNTER — Encounter: Payer: BC Managed Care – PPO | Admitting: Internal Medicine

## 2020-01-11 ENCOUNTER — Encounter: Payer: BC Managed Care – PPO | Admitting: Internal Medicine

## 2020-01-25 ENCOUNTER — Ambulatory Visit: Payer: BC Managed Care – PPO | Admitting: Internal Medicine

## 2020-01-25 ENCOUNTER — Encounter: Payer: Self-pay | Admitting: Internal Medicine

## 2020-01-25 ENCOUNTER — Other Ambulatory Visit: Payer: Self-pay

## 2020-01-25 DIAGNOSIS — A539 Syphilis, unspecified: Secondary | ICD-10-CM | POA: Diagnosis not present

## 2020-01-25 DIAGNOSIS — B20 Human immunodeficiency virus [HIV] disease: Secondary | ICD-10-CM | POA: Diagnosis not present

## 2020-01-25 DIAGNOSIS — F330 Major depressive disorder, recurrent, mild: Secondary | ICD-10-CM | POA: Diagnosis not present

## 2020-01-25 NOTE — Progress Notes (Signed)
Patient Active Problem List   Diagnosis Date Noted  . HIV disease (HCC) 11/19/2016    Priority: High  . Depression 12/16/2018  . Erectile dysfunction 11/20/2016  . Syphilis 11/19/2016    Patient's Medications  New Prescriptions   No medications on file  Previous Medications   GENVOYA 150-150-200-10 MG TABS TABLET    TAKE ONE TABLET BY MOUTH ONCE DAILY WITH BREAKFAST. STORE IN ORIGINALCONTAINER AT ROOM TEMPERATURE.   MULTIPLE VITAMIN (MULTIVITAMIN) TABLET    Take 1 tablet by mouth daily.   TRIAMCINOLONE CREAM (KENALOG) 0.1 %    Apply 1 application topically 2 (two) times daily.  Modified Medications   No medications on file  Discontinued Medications   No medications on file    Subjective: Aaron Forbes is in for his routine HIV follow-up visit.  He has not had any problems obtaining, taking or tolerating Genvoya and does not recall missing doses.  He takes it each evening with dinner.  He has been feeling well.  He is scheduled to get his first Covid vaccine later this week  He tells me that he has had 3 family members die of Covid which has been extremely tough for his family.  However overall he says that his mood is good.  He had a new partner last year who was unfaithful and contracted syphilis then gave syphilis to him.  He was seen at the health department in [facility] and treated for early syphilis in January.  He was having dental work done 2 days ago and while he was anesthetized the dental techs cut himself/herself.  They questioned him about whether or not he was taking his medication and he had to go to their urgent care center to have blood drawn even though he had blood work drawn here in January.  He said the whole experience was rather unnerving but he said that he can understand their concern.  He is planning on getting dental implants.  Review of Systems: Review of Systems  Constitutional: Negative for chills, diaphoresis, fever, malaise/fatigue and weight  loss.  HENT: Negative for sore throat.   Respiratory: Negative for cough, sputum production and shortness of breath.   Cardiovascular: Negative for chest pain.  Gastrointestinal: Negative for abdominal pain, diarrhea, heartburn, nausea and vomiting.  Genitourinary: Negative for dysuria and frequency.  Musculoskeletal: Negative for joint pain and myalgias.  Skin: Negative for rash.  Neurological: Negative for dizziness and headaches.  Psychiatric/Behavioral: Positive for depression. Negative for substance abuse. The patient is not nervous/anxious.     Past Medical History:  Diagnosis Date  . Asthma   . Colitis 09/21/2011  . ED (erectile dysfunction)   . HIV (human immunodeficiency virus infection) (HCC)   . Obesity, unspecified obesity severity, unspecified obesity type   . Syphilis 11/13/2010    Social History   Tobacco Use  . Smoking status: Never Smoker  . Smokeless tobacco: Never Used  Substance Use Topics  . Alcohol use: Yes    Comment: socially  . Drug use: No    Family History  Problem Relation Age of Onset  . Diabetes Mother   . Hypertension Mother   . Diabetes Father   . Hypertension Father   . Stroke Father     No Known Allergies  Health Maintenance  Topic Date Due  . TETANUS/TDAP  12/01/2016  . INFLUENZA VACCINE  07/02/2019  . HIV Screening  Completed    Objective:  Vitals:  01/25/20 1555  BP: (!) 150/67  Pulse: (!) 45  Weight: 220 lb (99.8 kg)   Body mass index is 23.58 kg/m.  Physical Exam Eyes:     Conjunctiva/sclera: Conjunctivae normal.  Cardiovascular:     Rate and Rhythm: Normal rate and regular rhythm.     Heart sounds: No murmur.  Pulmonary:     Effort: Pulmonary effort is normal.     Breath sounds: Normal breath sounds.  Abdominal:     Palpations: Abdomen is soft. There is no mass.     Tenderness: There is no abdominal tenderness.  Musculoskeletal:        General: Normal range of motion.  Skin:    Findings: No rash.    Neurological:     Mental Status: He is alert and oriented to person, place, and time.  Psychiatric:        Mood and Affect: Mood normal.     Lab Results Lab Results  Component Value Date   WBC 5.7 12/19/2019   HGB 13.3 12/19/2019   HCT 39.1 12/19/2019   MCV 91.1 12/19/2019   PLT 231 12/19/2019    Lab Results  Component Value Date   CREATININE 1.07 12/19/2019   BUN 18 12/19/2019   NA 137 12/19/2019   K 4.4 12/19/2019   CL 101 12/19/2019   CO2 30 12/19/2019    Lab Results  Component Value Date   ALT 26 12/19/2019   AST 33 12/19/2019   ALKPHOS 34 (L) 05/14/2017   BILITOT 1.1 12/19/2019    Lab Results  Component Value Date   CHOL 117 12/19/2019   HDL 45 12/19/2019   LDLCALC 58 12/19/2019   TRIG 63 12/19/2019   CHOLHDL 2.6 12/19/2019   Lab Results  Component Value Date   LABRPR REACTIVE (A) 12/19/2019   RPRTITER 1:16 (H) 12/19/2019   HIV 1 RNA Quant (copies/mL)  Date Value  12/19/2019 <20 NOT DETECTED  11/03/2018 <20 NOT DETECTED  11/17/2017 <20 NOT DETECTED   CD4 T Cell Abs (/uL)  Date Value  12/19/2019 881  11/03/2018 1,220  11/17/2017 1,150     Problem List Items Addressed This Visit      High   HIV disease ([place])    His infection remains under excellent, long-term control.  I told him that it is very unlikely that he could transmit HIV through dental procedures.  He will continue Genvoya and follow-up after lab work in 6 months.      Relevant Orders   T-helper cell (CD4)- ([facility] clinic only)   HIV-1 RNA quant-no reflex-bld   RPR     Unprioritized   Syphilis    His RPR titer had fallen to 1:2 1 year ago after treatment for early syphilis in late 2018.  His RPR is now back up to 1:16 following an exposure to a recent partner.  I cannot determine if this is early reinfection or late latent syphilis.  He was recently treated as early syphilis.  I will bring him back in 6 months for repeat RPR.  I talked to him about the importance of careful partner  selection and limiting the number of partners he has.      Depression    Despite losing 3 family members to Covid, his depression is actually a little better than it was 1 year ago.           Michel Bickers, MD [facility] [phone] pager  [phone] cell 01/25/2020, 5:04 PM

## 2020-01-25 NOTE — Assessment & Plan Note (Signed)
Despite losing 3 family members to Covid, his depression is actually a little better than it was 1 year ago.

## 2020-01-25 NOTE — Assessment & Plan Note (Signed)
His RPR titer had fallen to 1:2 1 year ago after treatment for early syphilis in late 2018.  His RPR is now back up to 1:16 following an exposure to a recent partner.  I cannot determine if this is early reinfection or late latent syphilis.  He was recently treated as early syphilis.  I will bring him back in 6 months for repeat RPR.  I talked to him about the importance of careful partner selection and limiting the number of partners he has.

## 2020-01-25 NOTE — Assessment & Plan Note (Signed)
His infection remains under excellent, long-term control.  I told him that it is very unlikely that he could transmit HIV through dental procedures.  He will continue Genvoya and follow-up after lab work in 6 months.

## 2020-02-01 ENCOUNTER — Telehealth: Payer: Self-pay

## 2020-02-01 NOTE — Telephone Encounter (Signed)
Received call from patient's dental office requesting patient's viral load labs. During most recent appointment, patient reported that a dental tech cut himself and was exposed to patient's blood. Dental office received patient's lab results from the patient and his MyChart but wanted confirmation of these results. Awaiting fax from provider with lab request.   Rosanna Randy, RN

## 2020-02-03 ENCOUNTER — Other Ambulatory Visit: Payer: Self-pay | Admitting: Internal Medicine

## 2020-07-03 ENCOUNTER — Other Ambulatory Visit: Payer: BC Managed Care – PPO

## 2020-07-10 ENCOUNTER — Other Ambulatory Visit: Payer: BC Managed Care – PPO

## 2020-07-11 ENCOUNTER — Other Ambulatory Visit: Payer: BC Managed Care – PPO

## 2020-07-11 ENCOUNTER — Other Ambulatory Visit: Payer: Self-pay

## 2020-07-11 DIAGNOSIS — B20 Human immunodeficiency virus [HIV] disease: Secondary | ICD-10-CM

## 2020-07-12 LAB — T-HELPER CELL (CD4) - (RCID CLINIC ONLY)
CD4 % Helper T Cell: 40 % (ref 33–65)
CD4 T Cell Abs: 967 /uL (ref 400–1790)

## 2020-07-14 LAB — RPR: RPR Ser Ql: REACTIVE — AB

## 2020-07-14 LAB — RPR TITER: RPR Titer: 1:2 {titer} — ABNORMAL HIGH

## 2020-07-14 LAB — HIV-1 RNA QUANT-NO REFLEX-BLD
HIV 1 RNA Quant: <20 Copies/mL
HIV-1 RNA Quant, Log: <1.3 Log cps/mL

## 2020-07-14 LAB — FLUORESCENT TREPONEMAL AB(FTA)-IGG-BLD: Fluorescent Treponemal ABS: REACTIVE — AB

## 2020-07-18 ENCOUNTER — Encounter: Payer: BC Managed Care – PPO | Admitting: Internal Medicine

## 2020-07-25 ENCOUNTER — Ambulatory Visit: Payer: BC Managed Care – PPO | Admitting: Internal Medicine

## 2020-07-25 ENCOUNTER — Encounter: Payer: Self-pay | Admitting: Internal Medicine

## 2020-07-25 ENCOUNTER — Other Ambulatory Visit: Payer: Self-pay

## 2020-07-25 DIAGNOSIS — B20 Human immunodeficiency virus [HIV] disease: Secondary | ICD-10-CM | POA: Diagnosis not present

## 2020-07-25 DIAGNOSIS — A539 Syphilis, unspecified: Secondary | ICD-10-CM

## 2020-07-25 MED ORDER — BICTEGRAVIR-EMTRICITAB-TENOFOV 50-200-25 MG PO TABS: 1.0000 | ORAL_TABLET | Freq: Every day | ORAL | 11 refills | Status: DC

## 2020-07-25 NOTE — Progress Notes (Signed)
Patient Active Problem List   Diagnosis Date Noted  . HIV disease (HCC) 11/19/2016    Priority: High  . Depression 12/16/2018  . Erectile dysfunction 11/20/2016  . Syphilis 11/19/2016    Patient's Medications  New Prescriptions   BICTEGRAVIR-EMTRICITABINE-TENOFOVIR AF (BIKTARVY) 50-200-25 MG TABS TABLET    Take 1 tablet by mouth daily.  Previous Medications   MULTIPLE VITAMIN (MULTIVITAMIN) TABLET    Take 1 tablet by mouth daily.   TRIAMCINOLONE CREAM (KENALOG) 0.1 %    Apply 1 application topically 2 (two) times daily.  Modified Medications   No medications on file  Discontinued Medications   GENVOYA 150-150-200-10 MG TABS TABLET    TAKE ONE TABLET BY MOUTH ONCE DAILY WITH BREAKFAST. STORE IN ORIGINALCONTAINER AT ROOM TEMPERATURE.    Subjective: Aaron Forbes is in for his routine HIV follow-up visit.  He has not had any problems obtaining, taking or tolerating his Genvoya well.  He does not recall missing doses.  He takes it each morning but does not take it with food.  He is not on any other medications currently.  He received his Pfizer Covid vaccines earlier this year.  He works out regularly and runs on weekends.  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  He is currently not in a relationship and has not been sexually active.  Review of Systems: Review of Systems  Constitutional: Negative for fever and weight loss.  Respiratory: Negative for cough and shortness of breath.   Cardiovascular: Negative for chest pain.  Psychiatric/Behavioral: Negative for depression.    Past Medical History:  Diagnosis Date  . Asthma   . Colitis 09/21/2011  . ED (erectile dysfunction)   . HIV (human immunodeficiency virus infection) (HCC)   . Obesity, unspecified obesity severity, unspecified obesity type   . Syphilis 11/13/2010    Social History   Tobacco Use  . Smoking status: Never Smoker  . Smokeless tobacco: Never Used  Substance Use Topics  . Alcohol  use: Yes    Comment: socially  . Drug use: No    Family History  Problem Relation Age of Onset  . Diabetes Mother   . Hypertension Mother   . Diabetes Father   . Hypertension Father   . Stroke Father     No Known Allergies  Health Maintenance  Topic Date Due  . Hepatitis C Screening  Never done  . TETANUS/TDAP  12/01/2016  . INFLUENZA VACCINE  07/01/2020  . HIV Screening  Completed    Objective:  Vitals:   07/25/20 1632  BP: (!) 146/82  Pulse: (!) 41  SpO2: 100%  Weight: 232 lb (105.2 kg)   Body mass index is 24.86 kg/m.  Physical Exam Constitutional:      Comments: He is talkative and in good spirits.  Cardiovascular:     Rate and Rhythm: Normal rate and regular rhythm.     Heart sounds: No murmur heard.   Pulmonary:     Effort: Pulmonary effort is normal.     Breath sounds: Normal breath sounds.  Psychiatric:        Mood and Affect: Mood normal.     Lab Results Lab Results  Component Value Date   WBC 5.7 12/19/2019   HGB 13.3 12/19/2019   HCT 39.1 12/19/2019   MCV 91.1 12/19/2019   PLT 231 12/19/2019    Lab Results  Component Value Date   CREATININE 1.07 12/19/2019  BUN 18 12/19/2019   NA 137 12/19/2019   K 4.4 12/19/2019   CL 101 12/19/2019   CO2 30 12/19/2019    Lab Results  Component Value Date   ALT 26 12/19/2019   AST 33 12/19/2019   ALKPHOS 34 (L) 05/14/2017   BILITOT 1.1 12/19/2019    Lab Results  Component Value Date   CHOL 117 12/19/2019   HDL 45 12/19/2019   LDLCALC 58 12/19/2019   TRIG 63 12/19/2019   CHOLHDL 2.6 12/19/2019   Lab Results  Component Value Date   LABRPR REACTIVE (A) 07/11/2020   RPRTITER 1:2 (H) 07/11/2020   HIV 1 RNA Quant  Date Value  07/11/2020 <20 Copies/mL  12/19/2019 <20 NOT DETECTED copies/mL  11/03/2018 <20 NOT DETECTED copies/mL   CD4 T Cell Abs (/uL)  Date Value  07/11/2020 967  12/19/2019 881  11/03/2018 1,220     Problem List Items Addressed This Visit      High   HIV  disease (HCC)    His infection remains under excellent, long-term control.  I will switch Genvoya to [place] which she can take with or without food.  He will follow-up after lab work in 1 year.      Relevant Medications   bictegravir-emtricitabine-tenofovir AF (BIKTARVY) 50-200-25 MG TABS tablet   Other Relevant Orders   CBC   T-helper cell (CD4)- ([facility] clinic only)   Comprehensive metabolic panel   Lipid panel   RPR   HIV-1 RNA quant-no reflex-bld     Unprioritized   Syphilis    His RPR is coming down appropriately after retreatment for syphilis in January.      Relevant Medications   bictegravir-emtricitabine-tenofovir AF (BIKTARVY) 50-200-25 MG TABS tablet        Cliffton Asters, MD [facility] [phone] pager   [phone] cell 07/25/2020, 4:59 PM

## 2020-07-25 NOTE — Assessment & Plan Note (Signed)
His infection remains under excellent, long-term control.  I will switch Genvoya to [place] which she can take with or without food.  He will follow-up after lab work in 1 year.

## 2020-07-25 NOTE — Assessment & Plan Note (Signed)
His RPR is coming down appropriately after retreatment for syphilis in January.

## 2020-11-20 ENCOUNTER — Encounter: Payer: Self-pay | Admitting: Internal Medicine

## 2020-11-20 ENCOUNTER — Ambulatory Visit (INDEPENDENT_AMBULATORY_CARE_PROVIDER_SITE_OTHER): Payer: BC Managed Care – PPO | Admitting: Internal Medicine

## 2020-11-20 ENCOUNTER — Other Ambulatory Visit: Payer: Self-pay

## 2020-11-20 ENCOUNTER — Other Ambulatory Visit (HOSPITAL_COMMUNITY)
Admission: RE | Admit: 2020-11-20 | Discharge: 2020-11-20 | Disposition: A | Discharge status: Home / Self Care | Payer: BC Managed Care – PPO | Source: Ambulatory Visit | Attending: Internal Medicine | Admitting: Internal Medicine

## 2020-11-20 VITALS — BP 144/81 | HR 54 | Temp 98.0°F | Ht >= 80 in | Wt 223.0 lb

## 2020-11-20 DIAGNOSIS — Z23 Encounter for immunization: Secondary | ICD-10-CM

## 2020-11-20 DIAGNOSIS — Z113 Encounter for screening for infections with a predominantly sexual mode of transmission: Secondary | ICD-10-CM | POA: Diagnosis not present

## 2020-11-20 DIAGNOSIS — A539 Syphilis, unspecified: Secondary | ICD-10-CM

## 2020-11-20 DIAGNOSIS — R21 Rash and other nonspecific skin eruption: Secondary | ICD-10-CM

## 2020-11-20 MED ORDER — ACYCLOVIR 400 MG PO TABS: 400.0000 mg | ORAL_TABLET | Freq: Three times a day (TID) | ORAL | 0 refills | Status: AC

## 2020-11-20 MED ORDER — PENICILLIN G BENZATHINE 1200000 UNIT/2ML IM SUSP
1.2000 10*6.[IU] | Freq: Once | INTRAMUSCULAR | Status: AC
Start: 2020-11-20 — End: 2020-11-20
  Administered 2020-11-20: 1.2 10*6.[IU] via INTRAMUSCULAR

## 2020-11-20 NOTE — Addendum Note (Signed)
Addended by: Harley Alto on: 11/20/2020 11:11 AM   Modules accepted: Orders

## 2020-11-20 NOTE — Progress Notes (Signed)
[facility]  Patient Active Problem List   Diagnosis Date Noted  . Depression 12/16/2018  . Erectile dysfunction 11/20/2016  . HIV disease (HCC) 11/19/2016  . Syphilis 11/19/2016      Subjective:    Patient ID: Aaron Forbes, male    DOB: 1986/11/10, 34 y.o.   MRN: [id number]  Chief Complaint  Patient presents with  . Follow-up    HPI:  Aaron Forbes is a 34 y.o. male hiv controlled, here for genital rash   Cream use (massage cream used) started before thanksgiving Rash starts right after cream use -- bump and feels raw when taking shower [facility] 11/23; given an abx shot told it was for gonorrhea (he had gonorrhea before); std testings done came back with rpr 1:2 (other tests negative per his report)  Rash improving but still there -- scabbed washed off during shower and feels stinging.   Rash doesn't hurt otherwise; no fever, chill, penile discharge. No headache, eye pain, joint pain. No lymph node swelling  Been with one partner -- since 01/2020. He is hiv positive on ART/controlled; assymatic  No Known Allergies    Outpatient Medications Prior to Visit  Medication Sig Dispense Refill  . bictegravir-emtricitabine-tenofovir AF (BIKTARVY) 50-200-25 MG TABS tablet Take 1 tablet by mouth daily. 30 tablet 11  . Multiple Vitamin (MULTIVITAMIN) tablet Take 1 tablet by mouth daily.    Marland Kitchen triamcinolone cream (KENALOG) 0.1 % Apply 1 application topically 2 (two) times daily. 30 g 0   Facility-Administered Medications Prior to Visit  Medication Dose Route Frequency Provider Last Rate Last Admin  . penicillin g benzathine (BICILLIN LA) 1200000 UNIT/2ML injection 1.2 Million Units  1.2 Million Units Intramuscular Once Cliffton Asters, MD      . penicillin g benzathine (BICILLIN LA) 1200000 UNIT/2ML injection 1.2 Million Units  1.2 Million Units Intramuscular Once Cliffton Asters, MD         Social History   Socioeconomic History  .  Marital status: Single    Spouse name: Not on file  . Number of children: Not on file  . Years of education: Not on file  . Highest education level: Not on file  Occupational History  . Not on file  Tobacco Use  . Smoking status: Never Smoker  . Smokeless tobacco: Never Used  Substance and Sexual Activity  . Alcohol use: Yes    Comment: socially  . Drug use: No  . Sexual activity: Not Currently    Partners: Male    Birth control/protection: Condom    Comment: given condoms  Other Topics Concern  . Not on file  Social History Narrative  . Not on file   Social Determinants of Health   Financial Resource Strain: Not on file  Food Insecurity: Not on file  Transportation Needs: Not on file  Physical Activity: Not on file  Stress: Not on file  Social Connections: Not on file  Intimate Partner Violence: Not on file      Review of Systems   negative 11 point ros unless mentioned above  Objective:    BP (!) 144/81   Pulse (!) 54   Temp 98 F (36.7 C)   Ht 6\' 9"  (2.057 m)   Wt 223 lb (101.2 kg)   BMI 23.90 kg/m  Nursing note and vital signs reviewed.  Physical Exam Well appearing   heent; normocephalic; per; conj clear; oropharynx clear Neck supple Lungs normal respiratory effort abd  s/nt Msk; no peripheral joint synovitis Skin; penile shaft and scrotal several < 1cm non-coalescent patch/hypotrophic area with 1 or 2 lesions appearing to be healing ulcerations Lymph: no groove sign or inguinal lad   Labs:  Micro:  Serology:  Imaging:  Assessment & Plan:   Problem List Items Addressed This Visit      Other   Syphilis   Relevant Medications   acyclovir (ZOVIRAX) 400 MG tablet    Other Visit Diagnoses    Screening for STDs (sexually transmitted diseases)    -  Primary   Relevant Orders   RPR   Urine cytology ancillary only   Rash       Relevant Orders   RPR   Need for immunization against influenza       Relevant Orders   Flu Vaccine QUAD 36+  mos IM (Completed)     #hx syphilis previously treated. Titer rpr 1:2 at health department around thanksgiving and previously.  -see below  #rash -some discomfort otherwise mostly nontender. Rash papular and ?ulcerating. ddx syphillis vs hsv. Chancroid less likely, and so is lgv -I think it is unlikely due to contact dermatitis as he had stopped using the cream right after the rash started (almost a month ago) -repeat std testing including swab for hsv pcr -empiric acyclovir and pcn benzathine once -f/u 1 week -avoid sexual encounters for the next 3-4 weeks    I am having Jenne Pane "[name]" maintain his multivitamin, triamcinolone, and bictegravir-emtricitabine-tenofovir AF. We will continue to administer penicillin g benzathine and penicillin g benzathine.   No orders of the defined types were placed in this encounter.    Follow-up: Return in about 1 week (around 11/27/2020).      Raymondo Band, MD [facility] -- -- pager   [phone] cell 11/20/2020, 8:57 AM

## 2020-11-20 NOTE — Patient Instructions (Addendum)
Will repeat std testings for you today including swab of the rash  It is unusual but I would consider covering (treating) for herpes and syphilis. While I don't think this is due to the cream (should have healed within a week after use), it is more beneficial to treat for the before mentioned either way (the risk of toxicity from antibiotics are so low vs the benefit is high)   Please follow up with [place] in 1 week  In the mean time avoid sexual encounter for the next 3-4 weeks or until lesions resolved   --------------- Today's treatment plan 1) penicillin intramuscular shot once  2) start acyclovir  You will also get your flu shot today

## 2020-11-20 NOTE — Addendum Note (Signed)
Addended by: Harley Alto on: 11/20/2020 11:16 AM   Modules accepted: Orders

## 2020-11-21 LAB — FLUORESCENT TREPONEMAL AB(FTA)-IGG-BLD: Fluorescent Treponemal ABS: REACTIVE — AB

## 2020-11-21 LAB — URINE CYTOLOGY ANCILLARY ONLY
Chlamydia: NEGATIVE
Comment: NEGATIVE
Comment: NORMAL
Neisseria Gonorrhea: NEGATIVE

## 2020-11-21 LAB — RPR: RPR Ser Ql: REACTIVE — AB

## 2020-11-21 LAB — RPR TITER: RPR Titer: 1:2 {titer} — ABNORMAL HIGH

## 2020-11-24 LAB — HERPES SIMPLEX VIRUS, TYPE 1 AND 2 DNA,QUAL,RT PCR
HSV 1 DNA: DETECTED — AB
HSV 2 DNA: NOT DETECTED

## 2020-12-04 ENCOUNTER — Encounter: Payer: Self-pay | Admitting: Internal Medicine

## 2020-12-04 ENCOUNTER — Ambulatory Visit (INDEPENDENT_AMBULATORY_CARE_PROVIDER_SITE_OTHER): Payer: BC Managed Care – PPO | Admitting: Internal Medicine

## 2020-12-04 ENCOUNTER — Other Ambulatory Visit: Payer: Self-pay

## 2020-12-04 VITALS — BP 138/83 | HR 55 | Temp 98.1°F | Ht >= 80 in | Wt 218.0 lb

## 2020-12-04 DIAGNOSIS — N5089 Other specified disorders of the male genital organs: Secondary | ICD-10-CM

## 2020-12-04 DIAGNOSIS — B009 Herpesviral infection, unspecified: Secondary | ICD-10-CM

## 2020-12-04 DIAGNOSIS — Z8619 Personal history of other infectious and parasitic diseases: Secondary | ICD-10-CM

## 2020-12-04 MED ORDER — ACYCLOVIR 400 MG PO TABS: 400.0000 mg | ORAL_TABLET | Freq: Three times a day (TID) | ORAL | 1 refills | Status: AC

## 2020-12-04 NOTE — Progress Notes (Signed)
[facility]  Patient Active Problem List   Diagnosis Date Noted  . Depression 12/16/2018  . Erectile dysfunction 11/20/2016  . HIV disease (HCC) 11/19/2016  . Syphilis 11/19/2016      Subjective:    Patient ID: Jasaun Carn, male    DOB: July 11, 1986, 35 y.o.   MRN: [id number]  No chief complaint on file.   HPI:  Liborio Saccente is a 35 y.o. male hiv controlled, here for genital rash follow up  12/04/2020 id f/u hsv 1 from penile/scrotal rash was positive Syphilis rpr titer remains 1:2 compared to 07/11/2020 and 1:16 from 12/19/2019 He did get a shot of penicillin as well during previous visit, along with empiric trial of acyclovir No f/c/n/v/diarrhea/new rash Genital ulcer had resolved He has no other concern today  Background from 12/21 visit: --------------- Cream use (massage cream used) started before thanksgiving Rash starts right after cream use -- bump and feels raw when taking shower [facility] 11/23; given an abx shot told it was for gonorrhea (he had gonorrhea before); std testings done came back with rpr 1:2 (other tests negative per his report)  Rash improving but still there -- scabbed washed off during shower and feels stinging.   Rash doesn't hurt otherwise; no fever, chill, penile discharge. No headache, eye pain, joint pain. No lymph node swelling  Been with one partner -- since 01/2020. He is hiv positive on ART/controlled; assymatic  No Known Allergies    Outpatient Medications Prior to Visit  Medication Sig Dispense Refill  . bictegravir-emtricitabine-tenofovir AF (BIKTARVY) 50-200-25 MG TABS tablet Take 1 tablet by mouth daily. 30 tablet 11  . Multiple Vitamin (MULTIVITAMIN) tablet Take 1 tablet by mouth daily.    Marland Kitchen triamcinolone cream (KENALOG) 0.1 % Apply 1 application topically 2 (two) times daily. 30 g 0   Facility-Administered Medications Prior to Visit  Medication Dose Route Frequency Provider Last  Rate Last Admin  . penicillin g benzathine (BICILLIN LA) 1200000 UNIT/2ML injection 1.2 Million Units  1.2 Million Units Intramuscular Once Cliffton Asters, MD      . penicillin g benzathine (BICILLIN LA) 1200000 UNIT/2ML injection 1.2 Million Units  1.2 Million Units Intramuscular Once Cliffton Asters, MD         Social History   Socioeconomic History  . Marital status: Single    Spouse name: Not on file  . Number of children: Not on file  . Years of education: Not on file  . Highest education level: Not on file  Occupational History  . Not on file  Tobacco Use  . Smoking status: Never Smoker  . Smokeless tobacco: Never Used  Substance and Sexual Activity  . Alcohol use: Yes    Comment: socially  . Drug use: No  . Sexual activity: Not Currently    Partners: Male    Birth control/protection: Condom    Comment: given condoms  Other Topics Concern  . Not on file  Social History Narrative  . Not on file   Social Determinants of Health   Financial Resource Strain: Not on file  Food Insecurity: Not on file  Transportation Needs: Not on file  Physical Activity: Not on file  Stress: Not on file  Social Connections: Not on file  Intimate Partner Violence: Not on file      Review of Systems  other ros negative  Objective:    There were no vitals taken for this visit. Nursing note and vital  signs reviewed.  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 Titer rpr 1:2 at health department around thanksgiving and previously.  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 Expecting hsv2, but both types can cause genital ulcer Rash resolved with acyclovir course which he finished -discuss recurrent nature of this; at this time no indication for prophylaxis unless several episodes a year  #hx syphilis rpr titer remains same on 12/21 from 07/2020. Given new rash had given him a shot of benzathine pcn as well -f/u 6 months for retesting of rpr titer  #hiv Well controlled -f/u dr Orvan Falconer for ongoing care    I am having Jenne Pane "[name]" maintain his multivitamin, triamcinolone, and bictegravir-emtricitabine-tenofovir AF. We will continue to administer penicillin g benzathine and penicillin g benzathine.   No orders of the defined types were placed in this encounter.    Follow-up: No follow-ups on file.      Raymondo Band, MD [facility] -- -- pager   [phone] cell 12/04/2020, 10:54 AM

## 2020-12-04 NOTE — Patient Instructions (Signed)
Your rash had resolved  If it comes back again you can restart acyclovir If it becomes frequent episodes we can consider putting you on daily prophylaxis  Your partner can also check for the same; as your body won't have 100% immunity to this  You can follow up with Dr Orvan Falconer for ongoing hiv care.    Please ask dr Orvan Falconer to recheck your syphilis titer when you see him  Thank you

## 2020-12-19 ENCOUNTER — Encounter: Payer: Self-pay | Admitting: Internal Medicine

## 2020-12-19 ENCOUNTER — Other Ambulatory Visit: Payer: Self-pay

## 2020-12-19 ENCOUNTER — Ambulatory Visit (INDEPENDENT_AMBULATORY_CARE_PROVIDER_SITE_OTHER): Payer: Self-pay | Admitting: Internal Medicine

## 2020-12-19 VITALS — BP 135/77 | HR 60 | Temp 98.0°F | Wt 206.0 lb

## 2020-12-19 DIAGNOSIS — L0390 Cellulitis, unspecified: Secondary | ICD-10-CM

## 2020-12-19 MED ORDER — SULFAMETHOXAZOLE-TRIMETHOPRIM 800-160 MG PO TABS: 1.0000 | ORAL_TABLET | Freq: Two times a day (BID) | ORAL | 0 refills | Status: DC

## 2020-12-19 NOTE — Progress Notes (Signed)
[facility]  Patient Active Problem List   Diagnosis Date Noted  . Depression 12/16/2018  . Erectile dysfunction 11/20/2016  . HIV disease (HCC) 11/19/2016  . Syphilis 11/19/2016      Subjective:    Patient ID: Aaron Forbes, male    DOB: 1986/09/07, 35 y.o.   MRN: [id number]  Chief Complaint  Patient presents with  . Follow-up    Sores on left arm and both  inner thighs      HPI:  Aaron Forbes is a 35 y.o. male hiv controlled, here for genital rash follow up  12/19/20 id f/u 10 days ago he had "pimples" on his forearms then his legs. They then pustules and then healed. Initial pain but improved after they pustuled. There is a small lesion on the left leg that hasn't yet ruptured. 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 Syphilis rpr titer remains 1:2 compared to 07/11/2020 and 1:16 from 12/19/2019 He did get a shot of penicillin as well during previous visit, along with empiric trial of acyclovir No f/c/n/v/diarrhea/new rash Genital ulcer had resolved He has no other concern today  Background from 12/21 visit: --------------- Cream use (massage cream used) started before thanksgiving Rash starts right after cream use -- bump and feels raw when taking shower [facility] 11/23; given an abx shot told it was for gonorrhea (he had gonorrhea before); std testings done came back with rpr 1:2 (other tests negative per his report)  Rash improving but still there -- scabbed washed off during shower and feels stinging.   Rash doesn't hurt otherwise; no fever, chill, penile discharge. No headache, eye pain, joint pain. No lymph node swelling  Been with one partner -- since 01/2020. He is hiv positive on ART/controlled; assymatic  Allergies  Allergen Reactions  . Iodine     Other reaction(s): shortness of  breath  . Shellfish-Derived Products     Other reaction(s): shortness of breath      Outpatient Medications Prior to Visit  Medication Sig Dispense Refill  . acyclovir (ZOVIRAX) 400 MG tablet Take 400 mg by mouth 3 (three) times daily.    . bictegravir-emtricitabine-tenofovir AF (BIKTARVY) 50-200-25 MG TABS tablet Take 1 tablet by mouth daily. 30 tablet 11  . Multiple Vitamin (MULTIVITAMIN) tablet Take 1 tablet by mouth daily.     No facility-administered medications prior to visit.     Social History   Socioeconomic History  . Marital status: Single    Spouse name: Not on file  . Number of children: Not on file  . Years of education: Not on file  . Highest education level: Not on file  Occupational History  . Not on file  Tobacco Use  . Smoking status: Never Smoker  . Smokeless tobacco: Never Used  Substance and Sexual Activity  . Alcohol use: Yes    Comment: socially  . Drug use: No  . Sexual activity: Not Currently    Partners: Male    Birth control/protection: Condom    Comment: given condoms  Other Topics Concern  . Not on file  Social History Narrative  . Not on file   Social Determinants of Health   Financial Resource Strain: Not on file  Food Insecurity: Not on file  Transportation Needs: Not on file  Physical Activity: Not  on file  Stress: Not on file  Social Connections: Not on file  Intimate Partner Violence: Not on file      Review of Systems  other ros negative  Objective:    BP 135/77   Pulse 60   Temp 98 F (36.7 C) (Oral)   Wt 206 lb (93.4 kg)   BMI 22.07 kg/m  Nursing note and vital signs reviewed.  Physical Exam Well appearing   heent; atraumatic; per; conj clear Neck supple Lungs normal respiratory effort abd s/nt Msk; no peripheral joint synovitis Skin; 4 lesions: 3 papule with some squamous quality and a center tract that had eschared over nontender -- posterior forearms and anterior right htigh; a smaller  papule/pustule that is tender on the left anterior thigh.    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 Or he might have more lesion First time he has something like this -bactrim rx given if more lesions or the old ones become carbuncles. -avoid using neosporin due to high risk contact dermatitis (not present today)  I am having Aaron Forbes "[name]" maintain his multivitamin, bictegravir-emtricitabine-tenofovir AF, and acyclovir.   No orders of the defined types were placed in this encounter.    Follow-up: No follow-ups on file.      Raymondo Band, MD [facility] -- -- pager   [phone] cell 12/19/2020, 2:27 PM

## 2020-12-19 NOTE — Patient Instructions (Signed)
You have abscess/cellulits. If more lesion or lesion becoming bigger/fluctuant/red/more painful or fever/chill, then take bactrim for 7 days

## 2021-07-16 ENCOUNTER — Other Ambulatory Visit: Payer: Self-pay | Admitting: Internal Medicine

## 2021-07-16 ENCOUNTER — Other Ambulatory Visit: Payer: BC Managed Care – PPO

## 2021-07-16 DIAGNOSIS — B20 Human immunodeficiency virus [HIV] disease: Secondary | ICD-10-CM

## 2021-07-16 DIAGNOSIS — A6000 Herpesviral infection of urogenital system, unspecified: Secondary | ICD-10-CM | POA: Insufficient documentation

## 2021-07-16 NOTE — Telephone Encounter (Signed)
Has appt on 8/17

## 2021-07-17 ENCOUNTER — Other Ambulatory Visit: Payer: Self-pay

## 2021-07-17 ENCOUNTER — Ambulatory Visit: Payer: BC Managed Care – PPO | Admitting: Internal Medicine

## 2021-07-17 ENCOUNTER — Encounter: Payer: Self-pay | Admitting: Internal Medicine

## 2021-07-17 DIAGNOSIS — A539 Syphilis, unspecified: Secondary | ICD-10-CM

## 2021-07-17 DIAGNOSIS — A6001 Herpesviral infection of penis: Secondary | ICD-10-CM | POA: Diagnosis not present

## 2021-07-17 DIAGNOSIS — B20 Human immunodeficiency virus [HIV] disease: Secondary | ICD-10-CM | POA: Diagnosis not present

## 2021-07-17 DIAGNOSIS — L0293 Carbuncle, unspecified: Secondary | ICD-10-CM

## 2021-07-17 MED ORDER — BICTEGRAVIR-EMTRICITAB-TENOFOV 50-200-25 MG PO TABS
1.0000 | ORAL_TABLET | Freq: Every day | ORAL | 11 refills | Status: DC
Start: 2021-07-17 — End: 2022-07-17

## 2021-07-17 MED ORDER — VALACYCLOVIR HCL 500 MG PO TABS: 500.0000 mg | ORAL_TABLET | Freq: Two times a day (BID) | ORAL | 11 refills | Status: DC

## 2021-07-17 NOTE — Assessment & Plan Note (Signed)
I have prescribed valacyclovir to use for episodic treatment of genital herpes.

## 2021-07-17 NOTE — Progress Notes (Signed)
Patient Active Problem List   Diagnosis Date Noted   HIV disease (HCC) 11/19/2016    Priority: High   Recurrent boils 07/17/2021   Genital herpes 07/16/2021   Depression 12/16/2018   Erectile dysfunction 11/20/2016   Syphilis 11/19/2016    Patient's Medications  New Prescriptions   VALACYCLOVIR (VALTREX) 500 MG TABLET    Take 1 tablet (500 mg total) by mouth 2 (two) times daily.  Previous Medications   MULTIPLE VITAMIN (MULTIVITAMIN) TABLET    Take 1 tablet by mouth daily.   SULFAMETHOXAZOLE-TRIMETHOPRIM (BACTRIM DS) 800-160 MG TABLET    Take 1 tablet by mouth 2 (two) times daily.  Modified Medications   Modified Medication Previous Medication   BICTEGRAVIR-EMTRICITABINE-TENOFOVIR AF (BIKTARVY) 50-200-25 MG TABS TABLET bictegravir-emtricitabine-tenofovir AF (BIKTARVY) 50-200-25 MG TABS tablet      Take 1 tablet by mouth daily.    Take 1 tablet by mouth daily.  Discontinued Medications   ACYCLOVIR (ZOVIRAX) 400 MG TABLET    Take 400 mg by mouth 3 (three) times daily.    Subjective: Aaron Forbes is in for his routine HIV follow-up visit.  He denies any problems obtaining, taking or tolerating his Biktarvy.  He recalls taking it late on 1 occasion but says that he has not missed any doses.  He was diagnosed with genital herpes earlier this year.  He recently had another outbreak and took acyclovir with resolution of the lesion on his penis.  He had a boil on his left buttocks that was lanced by his PCP in April.  He was treated with doxycycline.  His last sexual exposure was in early June with a new male partner.  He [facility] his [facility]pox vaccine [facility] recently.  Testing there for STI as was negative.  His RPR remained low and stable at 1:2.  School started again yesterday.  He was informed at that his schools math scores were some of the highest in the state.  He is very proud of that.  Review of Systems: Review of Systems  Constitutional:  Negative  for fever and weight loss.  Respiratory:  Negative for cough.   Cardiovascular:  Negative for chest pain.  Gastrointestinal:  Negative for abdominal pain, diarrhea, nausea and vomiting.  Skin:  Positive for rash. Negative for itching.   Past Medical History:  Diagnosis Date   Asthma    Colitis 09/21/2011   ED (erectile dysfunction)    HIV (human immunodeficiency virus infection) (HCC)    Obesity, unspecified obesity severity, unspecified obesity type    Syphilis 11/13/2010    Social History   Tobacco Use   Smoking status: Never   Smokeless tobacco: Never  Substance Use Topics   Alcohol use: Yes    Comment: socially   Drug use: No    Family History  Problem Relation Age of Onset   Diabetes Mother    Hypertension Mother    Diabetes Father    Hypertension Father    Stroke Father     Allergies  Allergen Reactions   Iodine     Other reaction(s): shortness of breath   Shellfish-Derived Products     Other reaction(s): shortness of breath    Health Maintenance  Topic Date Due   Hepatitis C Screening  Never done   TETANUS/TDAP  12/01/2016   COVID-19 Vaccine (3 - Pfizer risk series) 03/17/2020   INFLUENZA VACCINE  07/01/2021   Pneumococcal Vaccine 63-82 Years old (4 -  PPSV23 or PCV20) 04/08/2049   HIV Screening  Completed   HPV VACCINES  Aged Out    Objective:  Vitals:   07/17/21 0847  BP: (!) 137/95  Pulse: (!) 58  Temp: (!) 97.5 F (36.4 C)  TempSrc: Oral  SpO2: 100%  Weight: 220 lb 3.2 oz (99.9 kg)   Body mass index is 23.6 kg/m.  Physical Exam Constitutional:      Comments: He is in good spirits.  Cardiovascular:     Rate and Rhythm: Normal rate.  Pulmonary:     Effort: Pulmonary effort is normal.  Skin:    Findings: Rash present.     Comments: He has very small follicle-based healing lesions on his left knee and right foot compatible with folliculitis.  Psychiatric:        Mood and Affect: Mood normal.    Lab Results Lab Results   Component Value Date   WBC 5.7 12/19/2019   HGB 13.3 12/19/2019   HCT 39.1 12/19/2019   MCV 91.1 12/19/2019   PLT 231 12/19/2019    Lab Results  Component Value Date   CREATININE 1.07 12/19/2019   BUN 18 12/19/2019   NA 137 12/19/2019   K 4.4 12/19/2019   CL 101 12/19/2019   CO2 30 12/19/2019    Lab Results  Component Value Date   ALT 26 12/19/2019   AST 33 12/19/2019   ALKPHOS 34 (L) 05/14/2017   BILITOT 1.1 12/19/2019    Lab Results  Component Value Date   CHOL 117 12/19/2019   HDL 45 12/19/2019   LDLCALC 58 12/19/2019   TRIG 63 12/19/2019   CHOLHDL 2.6 12/19/2019   Lab Results  Component Value Date   LABRPR REACTIVE (A) 11/20/2020   RPRTITER 1:2 (H) 11/20/2020   HIV 1 RNA Quant  Date Value  07/11/2020 <20 Copies/mL  12/19/2019 <20 NOT DETECTED copies/mL  11/03/2018 <20 NOT DETECTED copies/mL   CD4 T Cell Abs (/uL)  Date Value  07/11/2020 967  12/19/2019 881  11/03/2018 1,220     Problem List Items Addressed This Visit       High   HIV disease (HCC)    His infection has been under excellent, long-term control since diagnosis in 2013.  He will get repeat blood work today, continue Radio producer and follow-up in 1 year.  I encouraged him to be very careful and limit the number of sexual partners he has.  He is scheduled to get his second monkeypox vaccine at the health department.      Relevant Medications   bictegravir-emtricitabine-tenofovir AF (BIKTARVY) 50-200-25 MG TABS tablet   valACYclovir (VALTREX) 500 MG tablet   Other Relevant Orders   T-helper cell (CD4)- ([facility] clinic only)   HIV-1 RNA quant-no reflex-bld   CBC   Comprehensive metabolic panel   RPR   Lipid panel     Unprioritized   Syphilis    His RPR responded appropriately to retreatment with syphilis last year.      Relevant Medications   bictegravir-emtricitabine-tenofovir AF (BIKTARVY) 50-200-25 MG TABS tablet   valACYclovir (VALTREX) 500 MG tablet   Genital herpes    I have  prescribed valacyclovir to use for episodic treatment of genital herpes.      Relevant Medications   bictegravir-emtricitabine-tenofovir AF (BIKTARVY) 50-200-25 MG TABS tablet   valACYclovir (VALTREX) 500 MG tablet   Recurrent boils   Relevant Medications   bictegravir-emtricitabine-tenofovir AF (BIKTARVY) 50-200-25 MG TABS tablet   valACYclovir (VALTREX) 500 MG tablet  Aaron Asters, MD [facility] [phone] pager   [phone] cell 07/17/2021, 9:13 AM

## 2021-07-17 NOTE — Assessment & Plan Note (Signed)
His RPR responded appropriately to retreatment with syphilis last year.

## 2021-07-17 NOTE — Assessment & Plan Note (Signed)
His infection has been under excellent, long-term control since diagnosis in 2013.  He will get repeat blood work today, continue Radio producer and follow-up in 1 year.  I encouraged him to be very careful and limit the number of sexual partners he has.  He is scheduled to get his second monkeypox vaccine at the health department.

## 2021-07-18 LAB — T-HELPER CELL (CD4) - (RCID CLINIC ONLY)
CD4 % Helper T Cell: 36 % (ref 33–65)
CD4 T Cell Abs: 708 /uL (ref 400–1790)

## 2021-07-20 LAB — CBC
HCT: 40.4 % (ref 38.5–50.0)
Hemoglobin: 13.4 g/dL (ref 13.2–17.1)
MCH: 30.7 pg (ref 27.0–33.0)
MCHC: 33.2 g/dL (ref 32.0–36.0)
MCV: 92.4 fL (ref 80.0–100.0)
MPV: 9.5 fL (ref 7.5–12.5)
Platelets: 318 10*3/uL (ref 140–400)
RBC: 4.37 10*6/uL (ref 4.20–5.80)
RDW: 11.3 % (ref 11.0–15.0)
WBC: 3.9 10*3/uL (ref 3.8–10.8)

## 2021-07-20 LAB — COMPREHENSIVE METABOLIC PANEL
AG Ratio: 1.1 (calc) (ref 1.0–2.5)
ALT: 22 U/L (ref 9–46)
AST: 29 U/L (ref 10–40)
Albumin: 4 g/dL (ref 3.6–5.1)
Alkaline phosphatase (APISO): 38 U/L (ref 36–130)
BUN: 18 mg/dL (ref 7–25)
CO2: 30 mmol/L (ref 20–32)
Calcium: 9.3 mg/dL (ref 8.6–10.3)
Chloride: 103 mmol/L (ref 98–110)
Creat: 1.13 mg/dL (ref 0.60–1.26)
Globulin: 3.5 g/dL (calc) (ref 1.9–3.7)
Glucose, Bld: 84 mg/dL (ref 65–99)
Potassium: 4.7 mmol/L (ref 3.5–5.3)
Sodium: 138 mmol/L (ref 135–146)
Total Bilirubin: 1.4 mg/dL — ABNORMAL HIGH (ref 0.2–1.2)
Total Protein: 7.5 g/dL (ref 6.1–8.1)

## 2021-07-20 LAB — LIPID PANEL
Cholesterol: 141 mg/dL (ref <200)
HDL: 44 mg/dL (ref >=40)
LDL Cholesterol (Calc): 81 mg/dL (calc)
Non-HDL Cholesterol (Calc): 97 mg/dL (calc) (ref <130)
Total CHOL/HDL Ratio: 3.2 (calc) (ref <5.0)
Triglycerides: 76 mg/dL (ref <150)

## 2021-07-20 LAB — RPR: RPR Ser Ql: REACTIVE — AB

## 2021-07-20 LAB — RPR TITER: RPR Titer: 1:2 {titer} — ABNORMAL HIGH

## 2021-07-20 LAB — HIV-1 RNA QUANT-NO REFLEX-BLD
HIV 1 RNA Quant: NOT DETECTED Copies/mL
HIV-1 RNA Quant, Log: NOT DETECTED Log cps/mL

## 2021-07-20 LAB — FLUORESCENT TREPONEMAL AB(FTA)-IGG-BLD: Fluorescent Treponemal ABS: REACTIVE — AB

## 2021-07-30 ENCOUNTER — Encounter: Payer: BC Managed Care – PPO | Admitting: Internal Medicine

## 2022-07-02 ENCOUNTER — Other Ambulatory Visit: Payer: Self-pay

## 2022-07-02 DIAGNOSIS — B20 Human immunodeficiency virus [HIV] disease: Secondary | ICD-10-CM

## 2022-07-02 DIAGNOSIS — Z79899 Other long term (current) drug therapy: Secondary | ICD-10-CM

## 2022-07-02 DIAGNOSIS — Z113 Encounter for screening for infections with a predominantly sexual mode of transmission: Secondary | ICD-10-CM

## 2022-07-03 ENCOUNTER — Other Ambulatory Visit: Payer: BC Managed Care – PPO

## 2022-07-03 ENCOUNTER — Other Ambulatory Visit: Payer: Self-pay

## 2022-07-03 DIAGNOSIS — Z113 Encounter for screening for infections with a predominantly sexual mode of transmission: Secondary | ICD-10-CM

## 2022-07-03 DIAGNOSIS — B20 Human immunodeficiency virus [HIV] disease: Secondary | ICD-10-CM

## 2022-07-03 DIAGNOSIS — Z79899 Other long term (current) drug therapy: Secondary | ICD-10-CM

## 2022-07-04 LAB — T-HELPER CELL (CD4) - (RCID CLINIC ONLY)
CD4 % Helper T Cell: 40 % (ref 33–65)
CD4 T Cell Abs: 734 /uL (ref 400–1790)

## 2022-07-07 LAB — COMPLETE METABOLIC PANEL WITH GFR
AG Ratio: 1.5 (calc) (ref 1.0–2.5)
ALT: 25 U/L (ref 9–46)
AST: 34 U/L (ref 10–40)
Albumin: 4.1 g/dL (ref 3.6–5.1)
Alkaline phosphatase (APISO): 36 U/L (ref 36–130)
BUN: 19 mg/dL (ref 7–25)
CO2: 30 mmol/L (ref 20–32)
Calcium: 9.3 mg/dL (ref 8.6–10.3)
Chloride: 104 mmol/L (ref 98–110)
Creat: 1.23 mg/dL (ref 0.60–1.26)
Globulin: 2.8 g/dL (calc) (ref 1.9–3.7)
Glucose, Bld: 87 mg/dL (ref 65–99)
Potassium: 4.5 mmol/L (ref 3.5–5.3)
Sodium: 139 mmol/L (ref 135–146)
Total Bilirubin: 1.2 mg/dL (ref 0.2–1.2)
Total Protein: 6.9 g/dL (ref 6.1–8.1)
eGFR: 77 mL/min/{1.73_m2} (ref >=60)

## 2022-07-07 LAB — CBC WITH DIFFERENTIAL/PLATELET
Absolute Monocytes: 344 cells/uL (ref 200–950)
Basophils Absolute: 19 cells/uL (ref 0–200)
Basophils Relative: 0.5 %
Eosinophils Absolute: 30 cells/uL (ref 15–500)
Eosinophils Relative: 0.8 %
HCT: 38.3 % — ABNORMAL LOW (ref 38.5–50.0)
Hemoglobin: 13.2 g/dL (ref 13.2–17.1)
Lymphs Abs: 2039 cells/uL (ref 850–3900)
MCH: 32 pg (ref 27.0–33.0)
MCHC: 34.5 g/dL (ref 32.0–36.0)
MCV: 92.7 fL (ref 80.0–100.0)
MPV: 9.9 fL (ref 7.5–12.5)
Monocytes Relative: 9.3 %
Neutro Abs: 1269 cells/uL — ABNORMAL LOW (ref 1500–7800)
Neutrophils Relative %: 34.3 %
Platelets: 193 10*3/uL (ref 140–400)
RBC: 4.13 10*6/uL — ABNORMAL LOW (ref 4.20–5.80)
RDW: 11.3 % (ref 11.0–15.0)
Total Lymphocyte: 55.1 %
WBC: 3.7 10*3/uL — ABNORMAL LOW (ref 3.8–10.8)

## 2022-07-07 LAB — LIPID PANEL
Cholesterol: 129 mg/dL (ref <200)
HDL: 61 mg/dL (ref >=40)
LDL Cholesterol (Calc): 58 mg/dL (calc)
Non-HDL Cholesterol (Calc): 68 mg/dL (calc) (ref <130)
Total CHOL/HDL Ratio: 2.1 (calc) (ref <5.0)
Triglycerides: 35 mg/dL (ref <150)

## 2022-07-07 LAB — RPR TITER: RPR Titer: 1:2 {titer} — ABNORMAL HIGH

## 2022-07-07 LAB — FLUORESCENT TREPONEMAL AB(FTA)-IGG-BLD: Fluorescent Treponemal ABS: REACTIVE — AB

## 2022-07-07 LAB — HIV-1 RNA QUANT-NO REFLEX-BLD
HIV 1 RNA Quant: NOT DETECTED Copies/mL
HIV-1 RNA Quant, Log: NOT DETECTED Log cps/mL

## 2022-07-07 LAB — RPR: RPR Ser Ql: REACTIVE — AB

## 2022-07-17 ENCOUNTER — Other Ambulatory Visit (HOSPITAL_COMMUNITY)
Admission: RE | Admit: 2022-07-17 | Discharge: 2022-07-17 | Disposition: A | Discharge status: Home / Self Care | Payer: BC Managed Care – PPO | Source: Ambulatory Visit | Attending: Internal Medicine | Admitting: Internal Medicine

## 2022-07-17 ENCOUNTER — Ambulatory Visit (INDEPENDENT_AMBULATORY_CARE_PROVIDER_SITE_OTHER): Payer: BC Managed Care – PPO | Admitting: Internal Medicine

## 2022-07-17 ENCOUNTER — Other Ambulatory Visit: Payer: Self-pay

## 2022-07-17 ENCOUNTER — Encounter: Payer: Self-pay | Admitting: Internal Medicine

## 2022-07-17 DIAGNOSIS — A6001 Herpesviral infection of penis: Secondary | ICD-10-CM | POA: Diagnosis not present

## 2022-07-17 DIAGNOSIS — B20 Human immunodeficiency virus [HIV] disease: Secondary | ICD-10-CM | POA: Diagnosis not present

## 2022-07-17 MED ORDER — VALACYCLOVIR HCL 500 MG PO TABS: 500.0000 mg | ORAL_TABLET | Freq: Two times a day (BID) | ORAL | 11 refills | Status: DC

## 2022-07-17 MED ORDER — BICTEGRAVIR-EMTRICITAB-TENOFOV 50-200-25 MG PO TABS: 1.0000 | ORAL_TABLET | Freq: Every day | ORAL | 11 refills | Status: DC

## 2022-07-17 NOTE — Assessment & Plan Note (Signed)
His infection remains under excellent, long-term control.  He will continue Biktarvy and follow-up after lab work in 1 year. 

## 2022-07-17 NOTE — Progress Notes (Signed)
Patient Active Problem List   Diagnosis Date Noted   HIV disease (HCC) 11/19/2016    Priority: High   Recurrent boils 07/17/2021   Genital herpes 07/16/2021   Depression 12/16/2018   Erectile dysfunction 11/20/2016   Syphilis 11/19/2016    Patient's Medications  New Prescriptions   No medications on file  Previous Medications   MULTIPLE VITAMIN (MULTIVITAMIN) TABLET    Take 1 tablet by mouth daily.   SULFAMETHOXAZOLE-TRIMETHOPRIM (BACTRIM DS) 800-160 MG TABLET    Take 1 tablet by mouth 2 (two) times daily.  Modified Medications   Modified Medication Previous Medication   BICTEGRAVIR-EMTRICITABINE-TENOFOVIR AF (BIKTARVY) 50-200-25 MG TABS TABLET bictegravir-emtricitabine-tenofovir AF (BIKTARVY) 50-200-25 MG TABS tablet      Take 1 tablet by mouth daily.    Take 1 tablet by mouth daily.   VALACYCLOVIR (VALTREX) 500 MG TABLET valACYclovir (VALTREX) 500 MG tablet      Take 1 tablet (500 mg total) by mouth 2 (two) times daily.    Take 1 tablet (500 mg total) by mouth 2 (two) times daily.  Discontinued Medications   No medications on file    Subjective: Aaron Forbes is in for Aaron Forbes routine HIV follow-up visit.  Aaron Forbes denies any problems obtaining, taking or tolerating Aaron Forbes Biktarvy.  Aaron Forbes takes it each afternoon.  Aaron Forbes recalls being late taking it some days but does not recall missing doses.  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  Aaron Forbes is in a new relationship with a male partner who is also a Runner, broadcasting/film/video.  Aaron Forbes partner is HIV positive and on Genvoya.  Aaron Forbes has needed to take directed valacyclovir on several occasions for outbreaks of genital herpes..  Aaron Forbes has not had any further boils recently.  Aaron Forbes denies feeling anxious or depressed.  Review of Systems: Review of Systems  Constitutional:  Negative for weight loss.  Psychiatric/Behavioral:  Negative for depression. The patient is not nervous/anxious.     Past Medical  History:  Diagnosis Date   Asthma    Colitis 09/21/2011   ED (erectile dysfunction)    HIV (human immunodeficiency virus infection) (HCC)    Obesity, unspecified obesity severity, unspecified obesity type    Syphilis 11/13/2010    Social History   Tobacco Use   Smoking status: Never   Smokeless tobacco: Never  Substance Use Topics   Alcohol use: Yes    Comment: socially   Drug use: No    Family History  Problem Relation Age of Onset   Diabetes Mother    Hypertension Mother    Diabetes Father    Hypertension Father    Stroke Father     Allergies  Allergen Reactions   Iodine     Other reaction(s): shortness of breath   Shellfish-Derived Products     Other reaction(s): shortness of breath    Health Maintenance  Topic Date Due   Hepatitis C Screening  Never done   TETANUS/TDAP  12/01/2016   COVID-19 Vaccine (4 - Pfizer risk series) 09/01/2021   INFLUENZA VACCINE  07/01/2022   HIV Screening  Completed   HPV VACCINES  Aged Out    Objective:  Vitals:   07/17/22 0844  BP: (!) 150/94  Pulse: 75  Resp: 16  SpO2: 98%  Weight: 224 lb 6.4 oz (101.8 kg)  Height: 6\' 9"  (2.057 m)   Body mass index is  24.05 kg/m.  Physical Exam Constitutional:      Comments: Aaron Forbes is in good spirits.  Cardiovascular:     Rate and Rhythm: Normal rate.  Pulmonary:     Effort: Pulmonary effort is normal.  Psychiatric:        Mood and Affect: Mood normal.     Lab Results Lab Results  Component Value Date   WBC 3.7 (L) 07/03/2022   HGB 13.2 07/03/2022   HCT 38.3 (L) 07/03/2022   MCV 92.7 07/03/2022   PLT 193 07/03/2022    Lab Results  Component Value Date   CREATININE 1.23 07/03/2022   BUN 19 07/03/2022   NA 139 07/03/2022   K 4.5 07/03/2022   CL 104 07/03/2022   CO2 30 07/03/2022    Lab Results  Component Value Date   ALT 25 07/03/2022   AST 34 07/03/2022   ALKPHOS 34 (L) 05/14/2017   BILITOT 1.2 07/03/2022    Lab Results  Component Value Date   CHOL 129  07/03/2022   HDL 61 07/03/2022   LDLCALC 58 07/03/2022   TRIG 35 07/03/2022   CHOLHDL 2.1 07/03/2022   Lab Results  Component Value Date   LABRPR REACTIVE (A) 07/03/2022   RPRTITER 1:2 (H) 07/03/2022   HIV 1 RNA Quant (Copies/mL)  Date Value  07/03/2022 Not Detected  07/17/2021 Not Detected  07/11/2020 <20   CD4 T Cell Abs (/uL)  Date Value  07/03/2022 734  07/17/2021 708  07/11/2020 967     Problem List Items Addressed This Visit       High   HIV disease (HCC)    Aaron Forbes infection remains under excellent, long-term control.  Aaron Forbes will continue Biktarvy and follow-up after lab work in 1 year.      Relevant Medications   valACYclovir (VALTREX) 500 MG tablet   bictegravir-emtricitabine-tenofovir AF (BIKTARVY) 50-200-25 MG TABS tablet   Other Relevant Orders   CBC   T-helper cells (CD4) count (not at [facility])   Comprehensive metabolic panel   Lipid panel   RPR   HIV-1 RNA quant-no reflex-bld   Urine cytology ancillary only   Cytology (oral, anal, urethral) ancillary only   Cytology (oral, anal, urethral) ancillary only     Unprioritized   Genital herpes   Relevant Medications   valACYclovir (VALTREX) 500 MG tablet   bictegravir-emtricitabine-tenofovir AF (BIKTARVY) 50-200-25 MG TABS tablet      Cliffton Asters, MD [facility] [phone] pager   [phone] cell 07/17/2022, 9:08 AM

## 2022-07-17 NOTE — Addendum Note (Signed)
Addended by: Clayborne Artist A on: 07/17/2022 09:26 AM   Modules accepted: Orders

## 2022-07-18 ENCOUNTER — Other Ambulatory Visit: Payer: Self-pay | Admitting: Internal Medicine

## 2022-07-18 DIAGNOSIS — B20 Human immunodeficiency virus [HIV] disease: Secondary | ICD-10-CM

## 2022-07-18 LAB — CYTOLOGY, (ORAL, ANAL, URETHRAL) ANCILLARY ONLY
Chlamydia: NEGATIVE
Comment: NEGATIVE
Comment: NORMAL
Neisseria Gonorrhea: NEGATIVE

## 2022-07-18 LAB — URINE CYTOLOGY ANCILLARY ONLY
Chlamydia: NEGATIVE
Comment: NEGATIVE
Comment: NORMAL
Neisseria Gonorrhea: NEGATIVE

## 2023-01-10 ENCOUNTER — Other Ambulatory Visit: Payer: Self-pay | Admitting: Internal Medicine

## 2023-01-10 DIAGNOSIS — A6001 Herpesviral infection of penis: Secondary | ICD-10-CM

## 2023-01-21 ENCOUNTER — Encounter: Payer: Self-pay | Admitting: Internal Medicine

## 2023-01-21 ENCOUNTER — Other Ambulatory Visit: Payer: Self-pay

## 2023-01-21 DIAGNOSIS — A6001 Herpesviral infection of penis: Secondary | ICD-10-CM

## 2023-01-21 MED ORDER — VALACYCLOVIR HCL 500 MG PO TABS: 500.0000 mg | ORAL_TABLET | Freq: Two times a day (BID) | ORAL | 5 refills | Status: DC

## 2023-06-30 ENCOUNTER — Other Ambulatory Visit: Payer: Self-pay

## 2023-06-30 DIAGNOSIS — B20 Human immunodeficiency virus [HIV] disease: Secondary | ICD-10-CM

## 2023-06-30 DIAGNOSIS — Z113 Encounter for screening for infections with a predominantly sexual mode of transmission: Secondary | ICD-10-CM

## 2023-07-02 ENCOUNTER — Other Ambulatory Visit: Payer: Self-pay

## 2023-07-02 ENCOUNTER — Ambulatory Visit: Payer: Self-pay

## 2023-07-02 ENCOUNTER — Encounter: Payer: Self-pay | Admitting: Internal Medicine

## 2023-07-02 DIAGNOSIS — B20 Human immunodeficiency virus [HIV] disease: Secondary | ICD-10-CM

## 2023-07-02 DIAGNOSIS — Z113 Encounter for screening for infections with a predominantly sexual mode of transmission: Secondary | ICD-10-CM

## 2023-07-05 LAB — COMPLETE METABOLIC PANEL WITHOUT GFR
AG Ratio: 1.4 (calc) (ref 1.0–2.5)
ALT: 20 U/L (ref 9–46)
AST: 30 U/L (ref 10–40)
Albumin: 4.1 g/dL (ref 3.6–5.1)
Alkaline phosphatase (APISO): 40 U/L (ref 36–130)
BUN: 17 mg/dL (ref 7–25)
CO2: 27 mmol/L (ref 20–32)
Calcium: 9.5 mg/dL (ref 8.6–10.3)
Chloride: 104 mmol/L (ref 98–110)
Creat: 1.01 mg/dL (ref 0.60–1.26)
Globulin: 3 g/dL (ref 1.9–3.7)
Glucose, Bld: 93 mg/dL (ref 65–99)
Potassium: 4 mmol/L (ref 3.5–5.3)
Sodium: 138 mmol/L (ref 135–146)
Total Bilirubin: 1.3 mg/dL — ABNORMAL HIGH (ref 0.2–1.2)
Total Protein: 7.1 g/dL (ref 6.1–8.1)
eGFR: 97 mL/min/1.73m2

## 2023-07-05 LAB — HIV-1 RNA QUANT-NO REFLEX-BLD
HIV 1 RNA Quant: 22 {copies}/mL — ABNORMAL HIGH
HIV-1 RNA Quant, Log: 1.34 {Log_copies}/mL — ABNORMAL HIGH

## 2023-07-05 LAB — CBC WITH DIFFERENTIAL/PLATELET
Absolute Monocytes: 490 {cells}/uL (ref 200–950)
Basophils Absolute: 22 {cells}/uL (ref 0–200)
Basophils Relative: 0.5 %
Eosinophils Absolute: 60 {cells}/uL (ref 15–500)
Eosinophils Relative: 1.4 %
HCT: 38 % — ABNORMAL LOW (ref 38.5–50.0)
Hemoglobin: 12.6 g/dL — ABNORMAL LOW (ref 13.2–17.1)
Lymphs Abs: 1935 {cells}/uL (ref 850–3900)
MCH: 30.8 pg (ref 27.0–33.0)
MCHC: 33.2 g/dL (ref 32.0–36.0)
MCV: 92.9 fL (ref 80.0–100.0)
MPV: 9.5 fL (ref 7.5–12.5)
Monocytes Relative: 11.4 %
Neutro Abs: 1793 {cells}/uL (ref 1500–7800)
Neutrophils Relative %: 41.7 %
Platelets: 290 Thousand/uL (ref 140–400)
RBC: 4.09 Million/uL — ABNORMAL LOW (ref 4.20–5.80)
RDW: 11.1 % (ref 11.0–15.0)
Total Lymphocyte: 45 %
WBC: 4.3 Thousand/uL (ref 3.8–10.8)

## 2023-07-06 ENCOUNTER — Other Ambulatory Visit: Payer: Self-pay

## 2023-07-06 ENCOUNTER — Encounter: Payer: Self-pay | Admitting: Internal Medicine

## 2023-07-06 DIAGNOSIS — B20 Human immunodeficiency virus [HIV] disease: Secondary | ICD-10-CM

## 2023-07-06 MED ORDER — BICTEGRAVIR-EMTRICITAB-TENOFOV 50-200-25 MG PO TABS: 1.0000 | ORAL_TABLET | Freq: Every day | ORAL | 0 refills | Status: DC

## 2023-07-10 ENCOUNTER — Telehealth: Payer: Self-pay

## 2023-07-10 ENCOUNTER — Other Ambulatory Visit: Payer: Self-pay

## 2023-07-10 DIAGNOSIS — A6001 Herpesviral infection of penis: Secondary | ICD-10-CM

## 2023-07-10 MED ORDER — VALACYCLOVIR HCL 500 MG PO TABS
500.0000 mg | ORAL_TABLET | Freq: Two times a day (BID) | ORAL | 0 refills | Status: AC
Start: 2023-07-10 — End: 2023-08-09

## 2023-07-10 NOTE — Addendum Note (Signed)
Addended by: Juanita Laster on: 07/10/2023 10:01 AM   Modules accepted: Orders

## 2023-07-10 NOTE — Telephone Encounter (Signed)
Received call from Tammy, Pharmacist with [facility]il[facility] pharmacy requesting refill on Valtrex. Will forward message to provider to advise if okay to send in refill. Appt scheduled for later this month.  Will need provider also to confirm prescription on file. If okay to send in 30 day supply.  Juanita Laster, RMA

## 2023-07-14 ENCOUNTER — Other Ambulatory Visit: Payer: Self-pay

## 2023-07-14 ENCOUNTER — Ambulatory Visit (INDEPENDENT_AMBULATORY_CARE_PROVIDER_SITE_OTHER): Payer: Self-pay | Admitting: Internal Medicine

## 2023-07-14 ENCOUNTER — Encounter: Payer: Self-pay | Admitting: Internal Medicine

## 2023-07-14 VITALS — BP 126/79 | HR 58 | Temp 97.5°F | Wt 231.0 lb

## 2023-07-14 DIAGNOSIS — Z113 Encounter for screening for infections with a predominantly sexual mode of transmission: Secondary | ICD-10-CM

## 2023-07-14 DIAGNOSIS — A6000 Herpesviral infection of urogenital system, unspecified: Secondary | ICD-10-CM

## 2023-07-14 DIAGNOSIS — B20 Human immunodeficiency virus [HIV] disease: Secondary | ICD-10-CM

## 2023-07-14 MED ORDER — DOXYCYCLINE HYCLATE 100 MG PO TABS: 100.0000 mg | ORAL_TABLET | Freq: Two times a day (BID) | ORAL | 5 refills | Status: DC

## 2023-07-14 NOTE — Patient Instructions (Addendum)
Please review anal cancer screening and let us know if you want to do it next time. It should be easy and can be self collected   I recommend std screening every 3 months and hiv labs every 6-12 months   Please remind [place] to test hepatitis b on your next visit   Doxycycline --> take 2 tablet once within a few hours unanticipated sex exposure but can be taken within 3 days sooner better   For your valtrex take 500 mg tablet once a day for prophylaxis

## 2023-07-14 NOTE — Progress Notes (Signed)
Patient Active Problem List   Diagnosis Date Noted   Recurrent boils 07/17/2021   Genital herpes 07/16/2021   Depression 12/16/2018   Erectile dysfunction 11/20/2016   HIV disease (HCC) 11/19/2016   Syphilis 11/19/2016    Patient's Medications  New Prescriptions   No medications on file  Previous Medications   BICTEGRAVIR-EMTRICITABINE-TENOFOVIR AF (BIKTARVY) 50-200-25 MG TABS TABLET    Take 1 tablet by mouth daily.   MULTIPLE VITAMIN (MULTIVITAMIN) TABLET    Take 1 tablet by mouth daily.   SULFAMETHOXAZOLE-TRIMETHOPRIM (BACTRIM DS) 800-160 MG TABLET    Take 1 tablet by mouth 2 (two) times daily.   VALACYCLOVIR (VALTREX) 500 MG TABLET    Take 1 tablet (500 mg total) by mouth 2 (two) times daily.  Modified Medications   No medications on file  Discontinued Medications   No medications on file    Subjective: Aaron Forbes is in for his routine HIV follow-up visit.  He denies any problems obtaining, taking or tolerating his Biktarvy.  He takes it each afternoon.  He recalls being late taking it some days but does not recall missing doses.  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  He is in a new relationship with a male partner who is also a Runner, broadcasting/film/video.  His partner is HIV positive and on Genvoya.  He has needed to take directed valacyclovir on several occasions for outbreaks of genital herpes..  He has not had any further boils recently.  He denies feeling anxious or depressed.    07/14/23 id clinic f/u  #hiv Dxed 2013; dx'ed via contact tracing No hx OI Started on tx immediately after dx. Started on atripla. Other meds include stribild --> genvoya Patient previously had been cared for by Dr Orvan Falconer He is here for routine hiv care. Doing well on biktarvy no missed dose last 4 weeks  Reviewed labs Slight elevation bilirubin and down on hb; one time value. assymptomatic Avid runner; no hematuria   He  wants doxy pep   #blood pressure Patient is concerned about high blood pressure. 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  Psychiatric/Behavioral:  Negative for depression. The patient is not nervous/anxious.     Past Medical History:  Diagnosis Date   Asthma    Colitis 09/21/2011   ED (erectile dysfunction)    HIV (human immunodeficiency virus infection) (HCC)    Obesity, unspecified obesity severity, unspecified obesity type    Syphilis 11/13/2010    Social History   Tobacco Use   Smoking status: Never   Smokeless tobacco: Never  Substance Use Topics   Alcohol use: Yes    Comment: socially   Drug use: No    Family History  Problem Relation Age of Onset   Diabetes Mother    Hypertension Mother    Diabetes Father    Hypertension Father    Stroke Father     Allergies  Allergen Reactions   Iodine     Other reaction(s): shortness of breath   Shellfish-Derived Products     Other reaction(s): shortness of breath  Health Maintenance  Topic Date Due   Hepatitis C Screening  Never done   DTaP/Tdap/Td (2 - Td or Tdap) 12/01/2016   COVID-19 Vaccine (4 - 2023-24 season) 08/01/2022   INFLUENZA VACCINE  07/02/2023   HIV Screening  Completed   HPV VACCINES  Aged Out    Objective:  Vitals:   07/14/23 0900  BP: (!) 147/90  Pulse: (!) 58  Temp: (!) 97.5 F (36.4 C)  TempSrc: Oral  SpO2: 99%  Weight: 231 lb (104.8 kg)   Body mass index is 24.75 kg/m.  Physical Exam Constitutional:      Comments: He is in good spirits.  Cardiovascular:     Rate and Rhythm: Normal rate.  Pulmonary:     Effort: Pulmonary effort is normal.  Psychiatric:        Mood and Affect: Mood normal.     Lab  Results Lab Results  Component Value Date   WBC 4.3 07/02/2023   HGB 12.6 (L) 07/02/2023   HCT 38.0 (L) 07/02/2023   MCV 92.9 07/02/2023   PLT 290 07/02/2023    Lab Results  Component Value Date   CREATININE 1.01 07/02/2023   BUN 17 07/02/2023   NA 138 07/02/2023   K 4.0 07/02/2023   CL 104 07/02/2023   CO2 27 07/02/2023    Lab Results  Component Value Date   ALT 20 07/02/2023   AST 30 07/02/2023   ALKPHOS 34 (L) 05/14/2017   BILITOT 1.3 (H) 07/02/2023    Lab Results  Component Value Date   CHOL 124 07/02/2023   HDL 47 07/02/2023   LDLCALC 61 07/02/2023   TRIG 75 07/02/2023   CHOLHDL 2.6 07/02/2023   Lab Results  Component Value Date   LABRPR REACTIVE (A) 07/02/2023   RPRTITER 1:1 (H) 07/02/2023   HIV 1 RNA Quant (Copies/mL)  Date Value  07/02/2023 22 (H)  07/03/2022 Not Detected  07/17/2021 Not Detected   CD4 T Cell Abs (/uL)  Date Value  07/02/2023 787  07/03/2022 734  07/17/2021 708     Problem List Items Addressed This Visit       Other   HIV disease (HCC) - Primary   Other Visit Diagnoses     Screening for STDs (sexually transmitted diseases)            #hiv Doing well on biktarvy    -discussed u=u -encourage compliance -continue current HIV medication -labs reviewed -f/u in 6 months -he wants to stick with biktarvy    #doxy pep Discussed this and he would like to be on  -recommend every 3 months std screen -doxy rx given   #elevated bp White coat hypertension Advise recheck daily at home    #hcm -vaccination Will refill -hepatitis  No hepatitis screening yet and we'll do in 3 months -std screen Negative 07/2023 throat/urine -cancer screening Will do anal pap next visit -- he said he doesn't do receptive anal sex but discussed patient is at higher risk and would advise screening      Aaron Band, MD [facility] for Infectious Disease [facility] [phone] pager   [phone]  cell 07/14/2023, 9:22 AM

## 2023-07-16 ENCOUNTER — Ambulatory Visit: Payer: BC Managed Care – PPO | Admitting: Internal Medicine

## 2023-08-06 ENCOUNTER — Other Ambulatory Visit: Payer: Self-pay | Admitting: Internal Medicine

## 2023-08-06 DIAGNOSIS — B20 Human immunodeficiency virus [HIV] disease: Secondary | ICD-10-CM

## 2023-09-11 ENCOUNTER — Other Ambulatory Visit: Payer: Self-pay | Admitting: Internal Medicine

## 2023-09-11 DIAGNOSIS — A6001 Herpesviral infection of penis: Secondary | ICD-10-CM

## 2023-09-11 DIAGNOSIS — B20 Human immunodeficiency virus [HIV] disease: Secondary | ICD-10-CM

## 2023-09-14 DIAGNOSIS — A6001 Herpesviral infection of penis: Secondary | ICD-10-CM

## 2023-09-14 MED ORDER — VALACYCLOVIR HCL 500 MG PO TABS: 1000.0000 mg | ORAL_TABLET | Freq: Two times a day (BID) | ORAL | 0 refills | Status: DC

## 2023-09-14 NOTE — Telephone Encounter (Signed)
Biktarvy refilled, please advise regarding Valtrex. Do you want me to send in new Rx for Valtrex 500 mg PO daily for prophylaxis? Thanks!

## 2023-09-14 NOTE — Telephone Encounter (Signed)
Thanks Aundra Millet  I figure he takes it for outbreak  I am not sure he was on prophy  But yes if he wants on hand as needed we can refill, or even once a day for outbreak prevention that's fine too

## 2023-09-14 NOTE — Telephone Encounter (Signed)
Confirmed dosing with Marcos Eke, NP that for suppressive therapy in a person living with HIV dose should be 500 mg BID.   Sandie Ano, RN

## 2023-10-06 ENCOUNTER — Encounter: Payer: Self-pay | Admitting: Internal Medicine

## 2023-10-06 ENCOUNTER — Ambulatory Visit (INDEPENDENT_AMBULATORY_CARE_PROVIDER_SITE_OTHER): Payer: Self-pay | Admitting: Internal Medicine

## 2023-10-06 ENCOUNTER — Other Ambulatory Visit: Payer: Self-pay

## 2023-10-06 VITALS — BP 138/80 | HR 52 | Temp 98.1°F | Resp 16 | Wt 197.8 lb

## 2023-10-06 DIAGNOSIS — B20 Human immunodeficiency virus [HIV] disease: Secondary | ICD-10-CM

## 2023-10-06 DIAGNOSIS — Z1159 Encounter for screening for other viral diseases: Secondary | ICD-10-CM

## 2023-10-06 DIAGNOSIS — Z23 Encounter for immunization: Secondary | ICD-10-CM

## 2023-10-06 DIAGNOSIS — Z113 Encounter for screening for infections with a predominantly sexual mode of transmission: Secondary | ICD-10-CM

## 2023-10-06 MED ORDER — BICTEGRAVIR-EMTRICITAB-TENOFOV 50-200-25 MG PO TABS: 1.0000 | ORAL_TABLET | Freq: Every day | ORAL | 11 refills | Status: DC

## 2023-10-06 MED ORDER — DOXYCYCLINE HYCLATE 100 MG PO TABS: 100.0000 mg | ORAL_TABLET | Freq: Two times a day (BID) | ORAL | 11 refills | Status: AC

## 2023-10-06 NOTE — Patient Instructions (Signed)
Please review google search on this "reprieve nejm hiv"  Let me know next time if you want to start on atoravastatin   Std screen today  Renew biktarvy/doxycycline   See me in 6 months

## 2023-10-06 NOTE — Progress Notes (Signed)
Patient Active Problem List   Diagnosis Date Noted   Recurrent boils 07/17/2021   Genital herpes 07/16/2021   Depression 12/16/2018   Erectile dysfunction 11/20/2016   HIV disease (HCC) 11/19/2016   Syphilis 11/19/2016    Patient's Medications  New Prescriptions   No medications on file  Previous Medications   BIKTARVY 50-200-25 MG TABS TABLET    TAKE 1 TABLET BY MOUTH DAILY   DOXYCYCLINE (VIBRA-TABS) 100 MG TABLET    Take 1 tablet (100 mg total) by mouth 2 (two) times daily.   MULTIPLE VITAMIN (MULTIVITAMIN) TABLET    Take 1 tablet by mouth daily.   SULFAMETHOXAZOLE-TRIMETHOPRIM (BACTRIM DS) 800-160 MG TABLET    Take 1 tablet by mouth 2 (two) times daily.   VALACYCLOVIR (VALTREX) 500 MG TABLET    Take 2 tablets (1,000 mg total) by mouth 2 (two) times daily. During an outbreak  Modified Medications   No medications on file  Discontinued Medications   No medications on file    Subjective: Aaron Forbes is in for his routine HIV follow-up visit.  He denies any problems obtaining, taking or tolerating his Biktarvy.  He takes it each afternoon.  He recalls being late taking it some days but does not recall missing doses.  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  He is in a new relationship with a male partner who is also a Runner, broadcasting/film/video.  His partner is HIV positive and on Genvoya.  He has needed to take directed valacyclovir on several occasions for outbreaks of genital herpes..  He has not had any further boils recently.  He denies feeling anxious or depressed.    07/14/23 id clinic f/u  #hiv Dxed 2013; dx'ed via contact tracing; sexual exposure No hx OI Started on tx immediately after dx. Started on atripla. Other meds include stribild --> genvoya Patient previously had been cared for by Dr Orvan Falconer He is here for routine hiv care. Doing well on biktarvy no missed dose last 4 weeks  Reviewed labs Slight  elevation bilirubin and down on hb; one time value. assymptomatic Avid runner; no hematuria   He wants doxy pep   #blood pressure Patient is concerned about high blood pressure. 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  Psychiatric/Behavioral:  Negative for depression. The patient is not nervous/anxious.     Past Medical History:  Diagnosis Date   Asthma    Colitis 09/21/2011   ED (erectile dysfunction)    HIV (human immunodeficiency virus infection) (HCC)    Obesity, unspecified obesity severity, unspecified obesity type    Syphilis 11/13/2010    Social History   Tobacco Use   Smoking status: Never   Smokeless tobacco: Never  Substance Use Topics   Alcohol use: Yes    Comment: socially   Drug use: No    Family History  Problem Relation Age of Onset   Diabetes Mother    Hypertension Mother    Diabetes Father  Hypertension Father    Stroke Father     Allergies  Allergen Reactions   Iodine     Other reaction(s): shortness of breath   Shellfish-Derived Products     Other reaction(s): shortness of breath    Health Maintenance  Topic Date Due   Hepatitis C Screening  Never done   DTaP/Tdap/Td (2 - Td or Tdap) 12/01/2016   INFLUENZA VACCINE  07/02/2023   COVID-19 Vaccine (4 - 2023-24 season) 08/02/2023   HIV Screening  Completed   HPV VACCINES  Aged Out    Objective:  Vitals:   10/06/23 0935  BP: (!) 149/90  Pulse: (!) 52  Resp: 16  Temp: 98.1 F (36.7 C)  TempSrc: Temporal  SpO2: 100%  Weight: 197 lb 12.8 oz (89.7 kg)   Body mass index is 21.2  kg/m.  Physical Exam Constitutional:      Comments: He is in good spirits.  Cardiovascular:     Rate and Rhythm: Normal rate.  Pulmonary:     Effort: Pulmonary effort is normal.  Psychiatric:        Mood and Affect: Mood normal.     Lab Results Lab Results  Component Value Date   WBC 4.3 07/02/2023   HGB 12.6 (L) 07/02/2023   HCT 38.0 (L) 07/02/2023   MCV 92.9 07/02/2023   PLT 290 07/02/2023    Lab Results  Component Value Date   CREATININE 1.01 07/02/2023   BUN 17 07/02/2023   NA 138 07/02/2023   K 4.0 07/02/2023   CL 104 07/02/2023   CO2 27 07/02/2023    Lab Results  Component Value Date   ALT 20 07/02/2023   AST 30 07/02/2023   ALKPHOS 34 (L) 05/14/2017   BILITOT 1.3 (H) 07/02/2023    Lab Results  Component Value Date   CHOL 124 07/02/2023   HDL 47 07/02/2023   LDLCALC 61 07/02/2023   TRIG 75 07/02/2023   CHOLHDL 2.6 07/02/2023   Lab Results  Component Value Date   LABRPR REACTIVE (A) 07/02/2023   RPRTITER 1:1 (H) 07/02/2023   HIV 1 RNA Quant (Copies/mL)  Date Value  07/02/2023 22 (H)  07/03/2022 Not Detected  07/17/2021 Not Detected   CD4 T Cell Abs (/uL)  Date Value  07/02/2023 787  07/03/2022 734  07/17/2021 708     Problem List Items Addressed This Visit   None     #hiv Doing well on biktarvy Prior therapy atripla, genvoya    10/06/23 id clinic assessment Doing well Compliant with biktarvy No complaint    -discussed u=u -encourage compliance -continue current HIV medication -labs 6 months -f/u in 6 months    #std screening #doxy pep Discussed this and he would like to be on  -std screening today -doxy renewed   #hx syphilis S/p late latent treatment in 2022 Rpr titer 64 in 2018 --> 2 in 2019 --> 16 in 12/2019 --> 1to2 since 2021  -rpr today   #hcm -vaccination Will review -hepatitis  Hepatitis screen today -std screen Negative 07/2023 throat/urine Repeat throat/urine gc/chlam today -cancer  screening Will do anal pap next visit -- he said he doesn't do receptive anal sex but discussed patient is at higher risk and would advise screening      Raymondo Band, MD [facility] Forbes for Infectious Disease [facility] [phone] pager   [phone] cell 10/06/2023, 9:53 AM

## 2023-10-07 LAB — URINE CYTOLOGY ANCILLARY ONLY
Chlamydia: NEGATIVE
Comment: NEGATIVE
Comment: NEGATIVE
Comment: NORMAL
Neisseria Gonorrhea: NEGATIVE
Trichomonas: NEGATIVE

## 2023-10-07 LAB — CYTOLOGY, (ORAL, ANAL, URETHRAL) ANCILLARY ONLY
Chlamydia: NEGATIVE
Comment: NEGATIVE
Comment: NORMAL
Neisseria Gonorrhea: NEGATIVE

## 2023-10-08 LAB — HEPATITIS B CORE ANTIBODY, TOTAL: Hep B Core Total Ab: NONREACTIVE

## 2023-10-08 LAB — T PALLIDUM AB: T Pallidum Abs: POSITIVE — AB

## 2023-10-08 LAB — RPR TITER: RPR Titer: 1:2 {titer} — ABNORMAL HIGH

## 2023-10-08 LAB — HEPATITIS B SURFACE ANTIBODY, QUANTITATIVE: Hep B S AB Quant (Post): 502 m[IU]/mL (ref >=10)

## 2023-10-08 LAB — RPR: RPR Ser Ql: REACTIVE — AB

## 2023-10-08 LAB — HEPATITIS C ANTIBODY: Hepatitis C Ab: NONREACTIVE

## 2023-10-08 LAB — HEPATITIS A ANTIBODY, TOTAL: Hepatitis A AB,Total: NONREACTIVE

## 2023-10-08 LAB — HEPATITIS B SURFACE ANTIGEN: Hepatitis B Surface Ag: NONREACTIVE

## 2024-04-19 ENCOUNTER — Ambulatory Visit: Payer: Self-pay | Admitting: Internal Medicine

## 2024-05-13 ENCOUNTER — Other Ambulatory Visit: Payer: Self-pay

## 2024-05-13 ENCOUNTER — Ambulatory Visit (INDEPENDENT_AMBULATORY_CARE_PROVIDER_SITE_OTHER): Payer: Self-pay | Admitting: Internal Medicine

## 2024-05-13 ENCOUNTER — Encounter: Payer: Self-pay | Admitting: Internal Medicine

## 2024-05-13 VITALS — BP 152/93 | HR 52 | Temp 97.4°F | Wt 246.0 lb

## 2024-05-13 DIAGNOSIS — Z129 Encounter for screening for malignant neoplasm, site unspecified: Secondary | ICD-10-CM

## 2024-05-13 DIAGNOSIS — N529 Male erectile dysfunction, unspecified: Secondary | ICD-10-CM

## 2024-05-13 DIAGNOSIS — Z113 Encounter for screening for infections with a predominantly sexual mode of transmission: Secondary | ICD-10-CM

## 2024-05-13 DIAGNOSIS — B20 Human immunodeficiency virus [HIV] disease: Secondary | ICD-10-CM

## 2024-05-13 MED ORDER — TADALAFIL 10 MG PO TABS: 10.0000 mg | ORAL_TABLET | Freq: Every day | ORAL | 3 refills | Status: AC | PRN

## 2024-05-13 NOTE — Progress Notes (Signed)
 Patient Active Problem List   Diagnosis Date Noted   Recurrent boils 07/17/2021   Genital herpes 07/16/2021   Depression 12/16/2018   Erectile dysfunction 11/20/2016   HIV disease (HCC) 11/19/2016   Syphilis 11/19/2016    Patient's Medications  New Prescriptions   TADALAFIL (CIALIS) 10 MG TABLET    Take 1 tablet (10 mg total) by mouth daily as needed for erectile dysfunction.  Previous Medications   BICTEGRAVIR-EMTRICITABINE-TENOFOVIR AF (BIKTARVY) 50-200-25 MG TABS TABLET    Take 1 tablet by mouth daily.   DOXYCYCLINE  (VIBRA -TABS) 100 MG TABLET    Take 1 tablet (100 mg total) by mouth 2 (two) times daily.   MULTIPLE VITAMIN (MULTIVITAMIN) TABLET    Take 1 tablet by mouth daily.   VALACYCLOVIR  (VALTREX ) 500 MG TABLET    Take 2 tablets (1,000 mg total) by mouth 2 (two) times daily. During an outbreak  Modified Medications   No medications on file  Discontinued Medications   No medications on file    Subjective: Aaron Forbes is in for his routine HIV follow-up visit.  He denies any problems obtaining, taking or tolerating his Biktarvy.  He takes it each afternoon.  He recalls being late taking it some days but does not recall missing doses.  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  He is in a new relationship with a male partner who is also a Runner, broadcasting/film/video.  His partner is HIV positive and on Genvoya.  He has needed to take directed valacyclovir  on several occasions for outbreaks of genital herpes..  He has not had any further boils recently.  He denies feeling anxious or depressed.    07/14/23 id clinic f/u  #hiv Dxed 2013; dx'ed via contact tracing; sexual exposure No hx OI Started on tx immediately after dx. Started on atripla. Other meds include stribild --> genvoya Patient previously had been cared for by Dr Daina Drum He is here for routine hiv care. Doing well on biktarvy no missed dose last 4  weeks  Reviewed labs Slight elevation bilirubin and down on hb; one time value. assymptomatic Avid runner; no hematuria   He wants doxy pep   #blood pressure Patient is concerned about high blood pressure. 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  Psychiatric/Behavioral:  Negative for depression. The patient is not nervous/anxious.     Past Medical History:  Diagnosis Date   Asthma    Colitis 09/21/2011   ED (erectile dysfunction)    HIV (human immunodeficiency virus infection) (HCC)    Obesity, unspecified obesity severity, unspecified obesity type    Syphilis 11/13/2010    Social History   Tobacco Use   Smoking status: Never   Smokeless tobacco: Never  Substance Use Topics   Alcohol use: Yes    Comment: socially   Drug use: No  Family History  Problem Relation Age of Onset   Diabetes Mother    Hypertension Mother    Diabetes Father    Hypertension Father    Stroke Father     Allergies  Allergen Reactions   Iodine     Other reaction(s): shortness of breath   Shellfish-Derived Products     Other reaction(s): shortness of breath    Health Maintenance  Topic Date Due   COVID-19 Vaccine (5 - Pfizer risk 2024-25 season) 04/04/2024   INFLUENZA VACCINE  07/01/2024   DTaP/Tdap/Td (3 - Td or Tdap) 10/05/2033   Pneumococcal Vaccine 35-30 Years old (4 of 4 - PCV20 or PCV21) 04/08/2034   Hepatitis C Screening  Completed   HIV Screening   Completed   HPV VACCINES  Aged Out   Meningococcal B Vaccine  Aged Out    Objective:  Vitals:   05/13/24 0853  BP: (!) 152/93  Pulse: (!) 52  Temp: (!) 97.4 F (36.3 C)  TempSrc: Oral  SpO2: 99%  Weight: 246 lb (111.6 kg)   Body mass index is 26.36 kg/m.  Physical Exam Constitutional:      Comments: He is in good spirits.   Cardiovascular:     Rate and Rhythm: Normal rate.  Pulmonary:     Effort: Pulmonary effort is normal.   Psychiatric:        Mood and Affect: Mood normal.   Heent: normocephalic; chipped tooth Neuro: nonfocal   Lab Results Lab Results  Component Value Date   WBC 4.3 07/02/2023   HGB 12.6 (L) 07/02/2023   HCT 38.0 (L) 07/02/2023   MCV 92.9 07/02/2023   PLT 290 07/02/2023    Lab Results  Component Value Date   CREATININE 1.01 07/02/2023   BUN 17 07/02/2023   NA 138 07/02/2023   K 4.0 07/02/2023   CL 104 07/02/2023   CO2 27 07/02/2023    Lab Results  Component Value Date   ALT 20 07/02/2023   AST 30 07/02/2023   ALKPHOS 34 (L) 05/14/2017   BILITOT 1.3 (H) 07/02/2023    Lab Results  Component Value Date   CHOL 124 07/02/2023   HDL 47 07/02/2023   LDLCALC 61 07/02/2023   TRIG 75 07/02/2023   CHOLHDL 2.6 07/02/2023   Lab Results  Component Value Date   LABRPR REACTIVE (A) 10/06/2023   RPRTITER 1:2 (H) 10/06/2023   HIV 1 RNA Quant (Copies/mL)  Date Value  07/02/2023 22 (H)  07/03/2022 Not Detected  07/17/2021 Not Detected   CD4 T Cell Abs (/uL)  Date Value  07/02/2023 787  07/03/2022 734  07/17/2021 708     Problem List Items Addressed This Visit     HIV disease (HCC)   Relevant Orders   HIV 1 RNA quant-no reflex-bld   CBC   COMPLETE METABOLIC PANEL WITHOUT GFR   Erectile dysfunction   Other Visit Diagnoses       Screening examination for venereal disease    -  Primary   Relevant Orders   C. trachomatis/N. gonorrhoeae RNA   GC/CT Probe, Amp (Throat)   RPR   Cytology - non gyn   CT/NG RNA, TMA Rectal      Cancer screening             #hiv Dx'ed 2013; contact tracing; msm Doing well on biktarvy Prior therapy atripla, genvoya  05/13/24 Doing well Compliant with biktarvy No complaint   -discussed u=u -encourage compliance -continue current HIV medication -labs  6 months -f/u in 6 months    #std screening #doxy pep Discussed this and he would like to be on  -std screening today; triple screen -patient in new relationship as of 05/12/24 visit   #erectyle dysfunction  -prn cialis rx 05/13/24; hx use of PDE inhibitor in the past   #hx syphilis S/p late latent treatment in 2022 Rpr titer 64 in 2018 --> 2 in 2019 --> 16 in 12/2019 --> 1to2 since 2021 10/2023 titer 2  -rpr today   #hcm -vaccination Will review -hepatitis  Hepatitis screen today -std screen Negative 10/2023 throat/urine -cancer screening He said he doesn't do receptive anal sex but discussed patient is at higher risk and would advise screening Anal pap 05/13/24 self collect     Jamesetta Mcbride, MD [facility] [phone] pager   [phone] cell 05/13/2024, 9:10 AM

## 2024-05-13 NOTE — Patient Instructions (Signed)
 Labs today Swab/blood Anal cancer screening   Continue biktarvy   Take cialis as needed (one tablet for the day activity is anticipated); review vision/optic nerve disturbance potential side effect   See me in 6 months

## 2024-05-14 LAB — CT/NG RNA, TMA RECTAL
Chlamydia Trachomatis RNA: NOT DETECTED
Neisseria Gonorrhoeae RNA: NOT DETECTED

## 2024-05-14 LAB — GC/CHLAMYDIA PROBE, AMP (THROAT)
Chlamydia trachomatis RNA: NOT DETECTED
Neisseria gonorrhoeae RNA: NOT DETECTED

## 2024-05-14 LAB — C. TRACHOMATIS/N. GONORRHOEAE RNA
C. trachomatis RNA, TMA: NOT DETECTED
N. gonorrhoeae RNA, TMA: NOT DETECTED

## 2024-05-17 LAB — COMPLETE METABOLIC PANEL WITHOUT GFR
AG Ratio: 1.3 (calc) (ref 1.0–2.5)
ALT: 28 U/L (ref 9–46)
AST: 35 U/L (ref 10–40)
Albumin: 4.3 g/dL (ref 3.6–5.1)
Alkaline phosphatase (APISO): 42 U/L (ref 36–130)
BUN: 16 mg/dL (ref 7–25)
CO2: 23 mmol/L (ref 20–32)
Calcium: 9.2 mg/dL (ref 8.6–10.3)
Chloride: 104 mmol/L (ref 98–110)
Creat: 1.12 mg/dL (ref 0.60–1.29)
Globulin: 3.2 g/dL (ref 1.9–3.7)
Glucose, Bld: 92 mg/dL (ref 65–99)
Potassium: 4 mmol/L (ref 3.5–5.3)
Sodium: 137 mmol/L (ref 135–146)
Total Bilirubin: 1.3 mg/dL — ABNORMAL HIGH (ref 0.2–1.2)
Total Protein: 7.5 g/dL (ref 6.1–8.1)

## 2024-05-17 LAB — HIV-1 RNA QUANT-NO REFLEX-BLD
HIV 1 RNA Quant: <20 {copies}/mL — AB
HIV-1 RNA Quant, Log: <1.3 {Log_copies}/mL — AB

## 2024-05-17 LAB — CBC
HCT: 44 % (ref 38.5–50.0)
Hemoglobin: 14.8 g/dL (ref 13.2–17.1)
MCH: 32 pg (ref 27.0–33.0)
MCHC: 33.6 g/dL (ref 32.0–36.0)
MCV: 95 fL (ref 80.0–100.0)
MPV: 9.6 fL (ref 7.5–12.5)
Platelets: 224 10*3/uL (ref 140–400)
RBC: 4.63 10*6/uL (ref 4.20–5.80)
RDW: 11.7 % (ref 11.0–15.0)
WBC: 5 10*3/uL (ref 3.8–10.8)

## 2024-05-17 LAB — NON-GYN, SPECIMEN A

## 2024-05-17 LAB — CYTOLOGY - NON PAP

## 2024-05-17 LAB — RPR TITER: RPR Titer: 1:2 {titer} — ABNORMAL HIGH

## 2024-05-17 LAB — T PALLIDUM AB: T Pallidum Abs: POSITIVE — AB

## 2024-05-17 LAB — RPR: RPR Ser Ql: REACTIVE — AB

## 2024-05-19 ENCOUNTER — Ambulatory Visit: Payer: Self-pay | Admitting: Internal Medicine

## 2024-06-24 ENCOUNTER — Ambulatory Visit: Payer: Self-pay | Admitting: Internal Medicine

## 2024-09-14 ENCOUNTER — Other Ambulatory Visit: Payer: Self-pay | Admitting: Internal Medicine

## 2024-09-14 DIAGNOSIS — A6001 Herpesviral infection of penis: Secondary | ICD-10-CM

## 2024-10-26 NOTE — Progress Notes (Signed)
 The ASCVD Risk score (Arnett DK, et al., 2019) failed to calculate for the following reasons:   The valid total cholesterol range is 130 to 320 mg/dL  Arlon Bergamo, BSN, RN

## 2024-11-01 ENCOUNTER — Other Ambulatory Visit: Payer: Self-pay | Admitting: Internal Medicine

## 2024-11-01 DIAGNOSIS — B20 Human immunodeficiency virus [HIV] disease: Secondary | ICD-10-CM

## 2024-11-01 DIAGNOSIS — Z202 Contact with and (suspected) exposure to infections with a predominantly sexual mode of transmission: Secondary | ICD-10-CM

## 2024-11-01 MED ORDER — DOXYCYCLINE HYCLATE 100 MG PO TABS: ORAL_TABLET | ORAL | 2 refills | Status: AC

## 2024-11-01 NOTE — Telephone Encounter (Signed)
 Notified provider that patient has been taking doxycycline  200 mg PO daily. Per Dr. Overton, okay to send in PRN doxycycline  200 mg PO PRN within 72 hours of unprotected sex.   Markitta Ausburn, BSN, RN

## 2024-11-01 NOTE — Addendum Note (Signed)
 Addended by: FLORENE BOUCHARD D on: 11/01/2024 01:00 PM   Modules accepted: Orders

## 2024-11-11 ENCOUNTER — Other Ambulatory Visit: Payer: Self-pay

## 2024-11-11 ENCOUNTER — Ambulatory Visit: Payer: Self-pay | Admitting: Internal Medicine

## 2024-11-11 VITALS — BP 150/92 | HR 60 | Temp 97.8°F | Ht >= 80 in | Wt 256.0 lb

## 2024-11-11 DIAGNOSIS — B20 Human immunodeficiency virus [HIV] disease: Secondary | ICD-10-CM

## 2024-11-11 DIAGNOSIS — Z8619 Personal history of other infectious and parasitic diseases: Secondary | ICD-10-CM

## 2024-11-11 DIAGNOSIS — Z79899 Other long term (current) drug therapy: Secondary | ICD-10-CM

## 2024-11-11 DIAGNOSIS — Z23 Encounter for immunization: Secondary | ICD-10-CM

## 2024-11-11 DIAGNOSIS — N529 Male erectile dysfunction, unspecified: Secondary | ICD-10-CM

## 2024-11-11 DIAGNOSIS — Z113 Encounter for screening for infections with a predominantly sexual mode of transmission: Secondary | ICD-10-CM

## 2024-11-11 NOTE — Patient Instructions (Signed)
 See me 10/2025 Will repeat pap smear then  Continue biktarvy   Covid/flu shot today

## 2024-11-11 NOTE — Progress Notes (Signed)
 Patient Active Problem List   Diagnosis Date Noted   Recurrent boils 07/17/2021   Genital herpes 07/16/2021   Depression 12/16/2018   Erectile dysfunction 11/20/2016   HIV disease (HCC) 11/19/2016   Syphilis 11/19/2016    Patient's Medications  New Prescriptions   No medications on file  Previous Medications   ACYCLOVIR  (ZOVIRAX ) 400 MG TABLET    Take 400 mg by mouth.   BIKTARVY 50-200-25 MG TABS TABLET    TAKE 1 TABLET BY MOUTH DAILY   DOXYCYCLINE  (VIBRA -TABS) 100 MG TABLET    Take 2 tablets (200 mg) by mouth as needed within 72 hours of unprotected sex   IBUPROFEN  (ADVIL ) 800 MG TABLET    Take 800 mg by mouth.   MULTIPLE VITAMIN (MULTIVITAMIN) TABLET    Take 1 tablet by mouth daily.   TADALAFIL  (CIALIS ) 10 MG TABLET    Take 1 tablet (10 mg total) by mouth daily as needed for erectile dysfunction.   VALACYCLOVIR  (VALTREX ) 500 MG TABLET    TAKE 1 TABLET(500 MG) BY MOUTH TWICE DAILY  Modified Medications   No medications on file  Discontinued Medications   No medications on file    Subjective: Aaron Forbes is in for his routine HIV follow-up visit.  He denies any problems obtaining, taking or tolerating his Biktarvy.  He takes it each afternoon.  He recalls being late taking it some days but does not recall missing doses.  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  He is in a new relationship with a male partner who is also a runner, broadcasting/film/video.  His partner is HIV positive and on Genvoya.  He has needed to take directed valacyclovir  on several occasions for outbreaks of genital herpes..  He has not had any further boils recently.  He denies feeling anxious or depressed.    07/14/23 id clinic f/u  #hiv Dxed 2013; dx'ed via contact tracing; sexual exposure No hx OI Started on tx immediately after dx. Started on atripla. Other meds include stribild --> genvoya Patient previously had been cared for by Dr Elaine He  is here for routine hiv care. Doing well on biktarvy no missed dose last 4 weeks  Reviewed labs Slight elevation bilirubin and down on hb; one time value. assymptomatic Avid runner; no hematuria   He wants doxy pep   #blood pressure Patient is concerned about high blood pressure. 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  Psychiatric/Behavioral:  Negative for depression. The patient is not nervous/anxious.     Past Medical History:  Diagnosis Date   Asthma    Colitis 09/21/2011   ED (erectile dysfunction)    HIV (human immunodeficiency virus infection) (HCC)    Obesity, unspecified obesity severity, unspecified obesity type    Syphilis 11/13/2010    Social History  Tobacco Use   Smoking status: Never   Smokeless tobacco: Never  Substance Use Topics   Alcohol use: Yes    Comment: socially   Drug use: No    Family History  Problem Relation Age of Onset   Diabetes Mother    Hypertension Mother    Diabetes Father    Hypertension Father    Stroke Father     Allergies  Allergen Reactions   Iodine     Other reaction(s): shortness of breath   Shellfish Protein-Containing Drug Products     Other reaction(s): shortness of breath    Health Maintenance  Topic Date Due   Hepatitis B Vaccines 19-59 Average Risk (1 of 3 - 19+ 3-dose series) Never done   HPV VACCINES (1 - Risk 3-dose SCDM series) Never done   Influenza  Vaccine  07/01/2024   COVID-19 Vaccine (5 - 2025-26 season) 08/01/2024   DTaP/Tdap/Td (3 - Td or Tdap) 10/05/2033   Pneumococcal Vaccine (4 of 4 - PCV20 or PCV21) 04/08/2034   Hepatitis C Screening  Completed   HIV Screening  Completed   Meningococcal B Vaccine  Aged Out    Objective:  Vitals:   11/11/24 0851  Weight: 256 lb (116.1 kg)  Height: 6' 8 (2.032 m)   Body mass index is 28.12 kg/m.  Physical Exam Constitutional:      Comments: He is in good spirits.  Cardiovascular:     Rate and Rhythm: Normal rate.  Pulmonary:     Effort: Pulmonary effort is normal.  Psychiatric:        Mood and Affect: Mood normal.   Heent: normocephalic; chipped tooth Neuro: nonfocal   Lab Results Lab Results  Component Value Date   WBC 5.0 05/13/2024   HGB 14.8 05/13/2024   HCT 44.0 05/13/2024   MCV 95.0 05/13/2024   PLT 224 05/13/2024    Lab Results  Component Value Date   CREATININE 1.12 05/13/2024   BUN 16 05/13/2024   NA 137 05/13/2024   K 4.0 05/13/2024   CL 104 05/13/2024   CO2 23 05/13/2024    Lab Results  Component Value Date   ALT 28 05/13/2024   AST 35 05/13/2024   ALKPHOS 34 (L) 05/14/2017   BILITOT 1.3 (H) 05/13/2024    Lab Results  Component Value Date   CHOL 124 07/02/2023   HDL 47 07/02/2023   LDLCALC 61 07/02/2023   TRIG 75 07/02/2023   CHOLHDL 2.6 07/02/2023   Lab Results  Component Value Date   LABRPR REACTIVE (A) 05/13/2024   RPRTITER 1:2 (H) 05/13/2024   HIV 1 RNA Quant  Date Value  05/13/2024 <20 DETECTED copies/mL (A)  07/02/2023 22 Copies/mL (H)  07/03/2022 Not Detected Copies/mL   CD4 T Cell Abs (/uL)  Date Value  07/02/2023 787  07/03/2022 734  07/17/2021 708     Problem List Items Addressed This Visit   None      #hiv Dx'ed 2013; contact tracing; msm Doing well on biktarvy Prior therapy atripla, genvoya  11/11/24  Doing well Compliant with biktarvy No complaint   -discussed u=u -encourage  compliance -continue current HIV medication -labs 6 months -f/u in 6 months   #social Working 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 Same relationship since 05/2024   #std screening #doxy pep Discussed this and he would like to be on  -std screening today; triple screen/rpr -same relationship since 05/12/24 visit   #erectyle dysfunction  -prn cialis  rx 05/13/24; hx use of  PDE inhibitor in the past   #hx syphilis S/p late latent treatment in 2022  -rpr today   #hcm -vaccination Flu/covid today 11/11/2024 -hepatitis  10/2023 hep b sAb 502 (passive); negtive hep a and hep c Ab -std screen 05/2024 triple screen negative; rpr serofast 1:2 -cancer screening He said he doesn't do receptive anal sex but discussed patient is at higher risk and would advise screening Anal pap 05/13/24 self collect -- normal benign Will repeat anal pap 10/2025     Constance ONEIDA Passer, MD [facility] [phone] pager   [phone] cell 11/11/2024, 8:53 AM

## 2024-11-11 NOTE — Addendum Note (Signed)
 Addended by: OVERTON FAITH T on: 11/11/2024 09:29 AM   Modules accepted: Orders

## 2024-11-11 NOTE — Addendum Note (Signed)
 Addended by: Ajani Schnieders M on: 11/11/2024 09:59 AM   Modules accepted: Orders

## 2024-11-14 LAB — CYTOLOGY, (ORAL, ANAL, URETHRAL) ANCILLARY ONLY
Chlamydia: NEGATIVE
Chlamydia: NEGATIVE
Comment: NEGATIVE
Comment: NEGATIVE
Comment: NORMAL
Comment: NORMAL
Neisseria Gonorrhea: NEGATIVE
Neisseria Gonorrhea: NEGATIVE

## 2024-11-14 LAB — URINE CYTOLOGY ANCILLARY ONLY
Chlamydia: NEGATIVE
Comment: NEGATIVE
Comment: NEGATIVE
Comment: NORMAL
Neisseria Gonorrhea: NEGATIVE
Trichomonas: NEGATIVE

## 2024-11-14 LAB — T-HELPER CELLS (CD4) COUNT (NOT AT ARMC)
Absolute CD4: 1147 {cells}/uL (ref 490–1740)
CD4 T Helper %: 44 % (ref 30–61)
Total lymphocyte count: 2622 {cells}/uL (ref 850–3900)

## 2024-11-15 LAB — SYPHILIS: RPR W/REFLEX TO RPR TITER AND TREPONEMAL ANTIBODIES, TRADITIONAL SCREENING AND DIAGNOSIS ALGORITHM: RPR Ser Ql: REACTIVE — AB

## 2024-11-15 LAB — T PALLIDUM AB: T Pallidum Abs: POSITIVE — AB

## 2024-11-15 LAB — HIV-1 RNA QUANT-NO REFLEX-BLD
HIV 1 RNA Quant: NOT DETECTED {copies}/mL
HIV-1 RNA Quant, Log: NOT DETECTED {Log_copies}/mL

## 2024-11-15 LAB — RPR TITER: RPR Titer: 1:2 {titer} — ABNORMAL HIGH

## 2024-11-21 ENCOUNTER — Other Ambulatory Visit: Payer: Self-pay | Admitting: Internal Medicine

## 2024-11-21 DIAGNOSIS — A6001 Herpesviral infection of penis: Secondary | ICD-10-CM

## 2024-11-21 DIAGNOSIS — B20 Human immunodeficiency virus [HIV] disease: Secondary | ICD-10-CM

## 2025-11-01 ENCOUNTER — Ambulatory Visit: Payer: Self-pay | Admitting: Internal Medicine
